# Patient Record
Sex: Female | Born: 1935 | Race: White | Hispanic: No | Marital: Married | Smoking: Never smoker
Health system: Southern US, Community
[De-identification: ages and names within clinical notes are randomized; demographics above are authoritative.]

## PROBLEM LIST (undated history)

## (undated) DIAGNOSIS — Z9889 Other specified postprocedural states: Secondary | ICD-10-CM

## (undated) DIAGNOSIS — E785 Hyperlipidemia, unspecified: Secondary | ICD-10-CM

## (undated) DIAGNOSIS — H409 Unspecified glaucoma: Secondary | ICD-10-CM

## (undated) DIAGNOSIS — M1990 Unspecified osteoarthritis, unspecified site: Secondary | ICD-10-CM

## (undated) DIAGNOSIS — I10 Essential (primary) hypertension: Secondary | ICD-10-CM

## (undated) DIAGNOSIS — E039 Hypothyroidism, unspecified: Secondary | ICD-10-CM

## (undated) DIAGNOSIS — R112 Nausea with vomiting, unspecified: Secondary | ICD-10-CM

## (undated) DIAGNOSIS — F32A Depression, unspecified: Secondary | ICD-10-CM

## (undated) DIAGNOSIS — F329 Major depressive disorder, single episode, unspecified: Secondary | ICD-10-CM

## (undated) HISTORY — PX: JOINT REPLACEMENT: SHX530

## (undated) HISTORY — PX: KNEE ARTHROSCOPY: SHX127

## (undated) HISTORY — PX: ABDOMINAL HYSTERECTOMY: SHX81

## (undated) HISTORY — PX: TONSILLECTOMY: SUR1361

## (undated) HISTORY — PX: CATARACT EXTRACTION W/ INTRAOCULAR LENS IMPLANT: SHX1309

## (undated) HISTORY — PX: OSTEOTOMY TOES: SUR996

---

## 1998-02-07 ENCOUNTER — Ambulatory Visit (HOSPITAL_COMMUNITY): Admission: RE | Admit: 1998-02-07 | Discharge: 1998-02-07 | Payer: Self-pay | Admitting: Obstetrics and Gynecology

## 1998-07-20 ENCOUNTER — Other Ambulatory Visit: Admission: RE | Admit: 1998-07-20 | Discharge: 1998-07-20 | Payer: Self-pay | Admitting: Obstetrics and Gynecology

## 1999-08-15 ENCOUNTER — Other Ambulatory Visit: Admission: RE | Admit: 1999-08-15 | Discharge: 1999-08-15 | Payer: Self-pay | Admitting: Obstetrics and Gynecology

## 1999-09-04 ENCOUNTER — Ambulatory Visit (HOSPITAL_COMMUNITY): Admission: RE | Admit: 1999-09-04 | Discharge: 1999-09-04 | Payer: Self-pay | Admitting: Obstetrics and Gynecology

## 1999-09-04 ENCOUNTER — Encounter: Payer: Self-pay | Admitting: Obstetrics and Gynecology

## 2000-02-12 ENCOUNTER — Encounter: Payer: Self-pay | Admitting: Obstetrics and Gynecology

## 2000-02-12 ENCOUNTER — Encounter: Admission: RE | Admit: 2000-02-12 | Discharge: 2000-02-12 | Payer: Self-pay | Admitting: Obstetrics and Gynecology

## 2000-10-03 ENCOUNTER — Ambulatory Visit (HOSPITAL_COMMUNITY): Admission: RE | Admit: 2000-10-03 | Discharge: 2000-10-03 | Payer: Self-pay | Admitting: Gastroenterology

## 2001-01-08 ENCOUNTER — Encounter: Payer: Self-pay | Admitting: Obstetrics and Gynecology

## 2001-01-08 ENCOUNTER — Ambulatory Visit (HOSPITAL_COMMUNITY): Admission: RE | Admit: 2001-01-08 | Discharge: 2001-01-08 | Payer: Self-pay | Admitting: Obstetrics and Gynecology

## 2001-02-24 ENCOUNTER — Encounter: Payer: Self-pay | Admitting: Obstetrics and Gynecology

## 2001-02-24 ENCOUNTER — Encounter: Admission: RE | Admit: 2001-02-24 | Discharge: 2001-02-24 | Payer: Self-pay | Admitting: Obstetrics and Gynecology

## 2001-11-14 ENCOUNTER — Encounter: Admission: RE | Admit: 2001-11-14 | Discharge: 2001-11-14 | Payer: Self-pay | Admitting: Obstetrics and Gynecology

## 2001-11-14 ENCOUNTER — Encounter: Payer: Self-pay | Admitting: Obstetrics and Gynecology

## 2002-01-14 ENCOUNTER — Encounter: Payer: Self-pay | Admitting: Obstetrics and Gynecology

## 2002-01-14 ENCOUNTER — Ambulatory Visit (HOSPITAL_COMMUNITY): Admission: RE | Admit: 2002-01-14 | Discharge: 2002-01-14 | Payer: Self-pay | Admitting: Obstetrics and Gynecology

## 2002-02-26 ENCOUNTER — Encounter: Payer: Self-pay | Admitting: Obstetrics and Gynecology

## 2002-02-26 ENCOUNTER — Encounter: Admission: RE | Admit: 2002-02-26 | Discharge: 2002-02-26 | Payer: Self-pay | Admitting: Obstetrics and Gynecology

## 2003-03-09 ENCOUNTER — Encounter: Admission: RE | Admit: 2003-03-09 | Discharge: 2003-03-09 | Payer: Self-pay | Admitting: Obstetrics and Gynecology

## 2003-03-09 ENCOUNTER — Encounter: Payer: Self-pay | Admitting: Obstetrics and Gynecology

## 2004-03-13 ENCOUNTER — Encounter: Admission: RE | Admit: 2004-03-13 | Discharge: 2004-03-13 | Payer: Self-pay | Admitting: Obstetrics and Gynecology

## 2004-07-24 ENCOUNTER — Other Ambulatory Visit: Payer: Self-pay

## 2004-09-02 ENCOUNTER — Ambulatory Visit: Payer: Self-pay | Admitting: Ophthalmology

## 2004-12-26 ENCOUNTER — Ambulatory Visit: Payer: Self-pay | Admitting: Gastroenterology

## 2005-01-12 ENCOUNTER — Ambulatory Visit: Payer: Self-pay | Admitting: Podiatry

## 2005-03-23 ENCOUNTER — Encounter: Admission: RE | Admit: 2005-03-23 | Discharge: 2005-03-23 | Payer: Self-pay | Admitting: Obstetrics and Gynecology

## 2005-03-29 ENCOUNTER — Other Ambulatory Visit: Admission: RE | Admit: 2005-03-29 | Discharge: 2005-03-29 | Payer: Self-pay | Admitting: Obstetrics and Gynecology

## 2005-10-02 ENCOUNTER — Ambulatory Visit: Payer: Self-pay | Admitting: Internal Medicine

## 2006-01-22 ENCOUNTER — Ambulatory Visit: Payer: Self-pay | Admitting: Ophthalmology

## 2006-01-30 ENCOUNTER — Ambulatory Visit: Payer: Self-pay | Admitting: Ophthalmology

## 2006-03-27 ENCOUNTER — Encounter: Admission: RE | Admit: 2006-03-27 | Discharge: 2006-03-27 | Payer: Self-pay | Admitting: Obstetrics and Gynecology

## 2006-04-11 ENCOUNTER — Ambulatory Visit: Payer: Self-pay | Admitting: Internal Medicine

## 2007-01-29 ENCOUNTER — Ambulatory Visit (HOSPITAL_COMMUNITY): Admission: RE | Admit: 2007-01-29 | Discharge: 2007-01-29 | Payer: Self-pay | Admitting: Obstetrics and Gynecology

## 2007-03-25 ENCOUNTER — Ambulatory Visit: Payer: Self-pay | Admitting: Gastroenterology

## 2007-04-07 ENCOUNTER — Other Ambulatory Visit: Admission: RE | Admit: 2007-04-07 | Discharge: 2007-04-07 | Payer: Self-pay | Admitting: Obstetrics and Gynecology

## 2007-04-14 ENCOUNTER — Encounter: Admission: RE | Admit: 2007-04-14 | Discharge: 2007-04-14 | Payer: Self-pay | Admitting: Obstetrics and Gynecology

## 2008-04-15 ENCOUNTER — Encounter: Admission: RE | Admit: 2008-04-15 | Discharge: 2008-04-15 | Payer: Self-pay | Admitting: Obstetrics and Gynecology

## 2008-05-04 ENCOUNTER — Ambulatory Visit: Payer: Self-pay | Admitting: Unknown Physician Specialty

## 2008-05-04 ENCOUNTER — Other Ambulatory Visit: Payer: Self-pay

## 2008-05-10 ENCOUNTER — Ambulatory Visit: Payer: Self-pay | Admitting: Unknown Physician Specialty

## 2008-07-14 ENCOUNTER — Ambulatory Visit: Payer: Self-pay | Admitting: Internal Medicine

## 2008-08-12 ENCOUNTER — Ambulatory Visit: Payer: Self-pay | Admitting: Ophthalmology

## 2008-08-23 ENCOUNTER — Ambulatory Visit: Payer: Self-pay | Admitting: Ophthalmology

## 2008-10-20 ENCOUNTER — Ambulatory Visit: Payer: Self-pay | Admitting: Internal Medicine

## 2009-04-18 ENCOUNTER — Ambulatory Visit: Payer: Self-pay | Admitting: Internal Medicine

## 2009-04-21 ENCOUNTER — Encounter: Admission: RE | Admit: 2009-04-21 | Discharge: 2009-04-21 | Payer: Self-pay | Admitting: Obstetrics and Gynecology

## 2009-05-13 ENCOUNTER — Other Ambulatory Visit: Admission: RE | Admit: 2009-05-13 | Discharge: 2009-05-13 | Payer: Self-pay | Admitting: Obstetrics and Gynecology

## 2009-10-18 ENCOUNTER — Ambulatory Visit: Payer: Self-pay | Admitting: Internal Medicine

## 2010-04-27 ENCOUNTER — Encounter: Admission: RE | Admit: 2010-04-27 | Discharge: 2010-04-27 | Payer: Self-pay | Admitting: Obstetrics and Gynecology

## 2010-06-12 ENCOUNTER — Ambulatory Visit: Payer: Self-pay | Admitting: Gastroenterology

## 2011-05-01 ENCOUNTER — Other Ambulatory Visit: Payer: Self-pay | Admitting: Obstetrics and Gynecology

## 2011-05-01 DIAGNOSIS — Z1231 Encounter for screening mammogram for malignant neoplasm of breast: Secondary | ICD-10-CM

## 2011-05-11 ENCOUNTER — Ambulatory Visit: Payer: Self-pay | Admitting: Physical Medicine and Rehabilitation

## 2011-05-16 ENCOUNTER — Ambulatory Visit
Admission: RE | Admit: 2011-05-16 | Discharge: 2011-05-16 | Disposition: A | Discharge status: Home / Self Care | Payer: PRIVATE HEALTH INSURANCE | Source: Ambulatory Visit | Attending: Obstetrics and Gynecology | Admitting: Obstetrics and Gynecology

## 2011-05-16 DIAGNOSIS — Z1231 Encounter for screening mammogram for malignant neoplasm of breast: Secondary | ICD-10-CM

## 2011-08-03 ENCOUNTER — Ambulatory Visit: Payer: Self-pay | Admitting: Unknown Physician Specialty

## 2011-08-09 ENCOUNTER — Ambulatory Visit: Payer: Self-pay | Admitting: Unknown Physician Specialty

## 2012-05-12 ENCOUNTER — Other Ambulatory Visit: Payer: Self-pay | Admitting: Internal Medicine

## 2012-05-12 ENCOUNTER — Other Ambulatory Visit: Payer: Self-pay | Admitting: Obstetrics and Gynecology

## 2012-05-12 DIAGNOSIS — Z1231 Encounter for screening mammogram for malignant neoplasm of breast: Secondary | ICD-10-CM

## 2012-05-21 ENCOUNTER — Ambulatory Visit
Admission: RE | Admit: 2012-05-21 | Discharge: 2012-05-21 | Disposition: A | Discharge status: Home / Self Care | Payer: PRIVATE HEALTH INSURANCE | Source: Ambulatory Visit | Attending: Internal Medicine | Admitting: Internal Medicine

## 2012-05-21 DIAGNOSIS — Z1231 Encounter for screening mammogram for malignant neoplasm of breast: Secondary | ICD-10-CM

## 2013-05-12 ENCOUNTER — Other Ambulatory Visit: Payer: Self-pay

## 2013-05-12 DIAGNOSIS — Z1231 Encounter for screening mammogram for malignant neoplasm of breast: Secondary | ICD-10-CM

## 2013-06-10 ENCOUNTER — Ambulatory Visit
Admission: RE | Admit: 2013-06-10 | Discharge: 2013-06-10 | Disposition: A | Discharge status: Home / Self Care | Payer: PRIVATE HEALTH INSURANCE | Source: Ambulatory Visit

## 2013-06-10 DIAGNOSIS — Z1231 Encounter for screening mammogram for malignant neoplasm of breast: Secondary | ICD-10-CM

## 2013-09-18 ENCOUNTER — Ambulatory Visit: Payer: Self-pay | Admitting: Ophthalmology

## 2014-05-14 DIAGNOSIS — F411 Generalized anxiety disorder: Secondary | ICD-10-CM | POA: Insufficient documentation

## 2014-05-14 DIAGNOSIS — N952 Postmenopausal atrophic vaginitis: Secondary | ICD-10-CM | POA: Insufficient documentation

## 2014-05-14 DIAGNOSIS — N951 Menopausal and female climacteric states: Secondary | ICD-10-CM | POA: Insufficient documentation

## 2014-05-14 DIAGNOSIS — I10 Essential (primary) hypertension: Secondary | ICD-10-CM | POA: Insufficient documentation

## 2014-05-14 DIAGNOSIS — E785 Hyperlipidemia, unspecified: Secondary | ICD-10-CM | POA: Insufficient documentation

## 2014-06-21 ENCOUNTER — Other Ambulatory Visit: Payer: Self-pay

## 2014-06-21 DIAGNOSIS — Z1231 Encounter for screening mammogram for malignant neoplasm of breast: Secondary | ICD-10-CM

## 2014-06-24 ENCOUNTER — Ambulatory Visit
Admission: RE | Admit: 2014-06-24 | Discharge: 2014-06-24 | Disposition: A | Discharge status: Home / Self Care | Payer: Medicare Other | Source: Ambulatory Visit

## 2014-06-24 ENCOUNTER — Encounter (INDEPENDENT_AMBULATORY_CARE_PROVIDER_SITE_OTHER): Payer: Self-pay

## 2014-06-24 DIAGNOSIS — Z1231 Encounter for screening mammogram for malignant neoplasm of breast: Secondary | ICD-10-CM

## 2014-06-28 ENCOUNTER — Other Ambulatory Visit: Payer: Self-pay | Admitting: Family Medicine

## 2014-06-28 DIAGNOSIS — R928 Other abnormal and inconclusive findings on diagnostic imaging of breast: Secondary | ICD-10-CM

## 2014-07-05 ENCOUNTER — Ambulatory Visit
Admission: RE | Admit: 2014-07-05 | Discharge: 2014-07-05 | Disposition: A | Discharge status: Home / Self Care | Payer: Medicare Other | Source: Ambulatory Visit | Attending: Family Medicine | Admitting: Family Medicine

## 2014-07-05 DIAGNOSIS — R928 Other abnormal and inconclusive findings on diagnostic imaging of breast: Secondary | ICD-10-CM

## 2015-02-28 DIAGNOSIS — M5030 Other cervical disc degeneration, unspecified cervical region: Secondary | ICD-10-CM | POA: Insufficient documentation

## 2015-02-28 DIAGNOSIS — M5412 Radiculopathy, cervical region: Secondary | ICD-10-CM | POA: Insufficient documentation

## 2015-07-08 ENCOUNTER — Other Ambulatory Visit: Payer: Self-pay

## 2015-07-08 DIAGNOSIS — Z1231 Encounter for screening mammogram for malignant neoplasm of breast: Secondary | ICD-10-CM

## 2015-07-12 ENCOUNTER — Ambulatory Visit
Admission: RE | Admit: 2015-07-12 | Discharge: 2015-07-12 | Disposition: A | Discharge status: Home / Self Care | Payer: Medicare Other | Source: Ambulatory Visit

## 2015-07-12 DIAGNOSIS — Z1231 Encounter for screening mammogram for malignant neoplasm of breast: Secondary | ICD-10-CM

## 2015-07-27 ENCOUNTER — Encounter
Admission: RE | Admit: 2015-07-27 | Discharge: 2015-07-27 | Disposition: A | Discharge status: Home / Self Care | Payer: Medicare Other | Source: Ambulatory Visit | Attending: Orthopedic Surgery | Admitting: Orthopedic Surgery

## 2015-07-27 DIAGNOSIS — Z01810 Encounter for preprocedural cardiovascular examination: Secondary | ICD-10-CM | POA: Insufficient documentation

## 2015-07-27 DIAGNOSIS — Z01812 Encounter for preprocedural laboratory examination: Secondary | ICD-10-CM | POA: Diagnosis present

## 2015-07-27 HISTORY — DX: Hypothyroidism, unspecified: E03.9

## 2015-07-27 HISTORY — DX: Depression, unspecified: F32.A

## 2015-07-27 HISTORY — DX: Major depressive disorder, single episode, unspecified: F32.9

## 2015-07-27 HISTORY — DX: Essential (primary) hypertension: I10

## 2015-07-27 HISTORY — DX: Hyperlipidemia, unspecified: E78.5

## 2015-07-27 LAB — CBC
HCT: 42.6 % (ref 35.0–47.0)
Hemoglobin: 14.2 g/dL (ref 12.0–16.0)
MCH: 30.5 pg (ref 26.0–34.0)
MCHC: 33.3 g/dL (ref 32.0–36.0)
MCV: 91.7 fL (ref 80.0–100.0)
PLATELETS: 327 10*3/uL (ref 150–440)
RBC: 4.64 MIL/uL (ref 3.80–5.20)
RDW: 13.7 % (ref 11.5–14.5)
WBC: 8.2 10*3/uL (ref 3.6–11.0)

## 2015-07-27 LAB — URINALYSIS COMPLETE WITH MICROSCOPIC (ARMC ONLY)
BACTERIA UA: NONE SEEN
Bilirubin Urine: NEGATIVE
GLUCOSE, UA: NEGATIVE mg/dL
HGB URINE DIPSTICK: NEGATIVE
Ketones, ur: NEGATIVE mg/dL
Nitrite: NEGATIVE
PH: 6 (ref 5.0–8.0)
PROTEIN: NEGATIVE mg/dL
SPECIFIC GRAVITY, URINE: 1.015 (ref 1.005–1.030)

## 2015-07-27 LAB — TYPE AND SCREEN
ABO/RH(D): A NEG
Antibody Screen: NEGATIVE

## 2015-07-27 LAB — APTT: aPTT: 29 seconds (ref 24–36)

## 2015-07-27 LAB — BASIC METABOLIC PANEL
Anion gap: 9 (ref 5–15)
BUN: 22 mg/dL — AB (ref 6–20)
CALCIUM: 10.2 mg/dL (ref 8.9–10.3)
CO2: 29 mmol/L (ref 22–32)
CREATININE: 0.74 mg/dL (ref 0.44–1.00)
Chloride: 100 mmol/L — ABNORMAL LOW (ref 101–111)
GFR calc Af Amer: >60 mL/min (ref >60)
GFR calc non Af Amer: >60 mL/min (ref >60)
GLUCOSE: 135 mg/dL — AB (ref 65–99)
Potassium: 2.9 mmol/L — CL (ref 3.5–5.1)
Sodium: 138 mmol/L (ref 135–145)

## 2015-07-27 LAB — SURGICAL PCR SCREEN
MRSA, PCR: NEGATIVE
STAPHYLOCOCCUS AUREUS: NEGATIVE

## 2015-07-27 LAB — ABO/RH: ABO/RH(D): A NEG

## 2015-07-27 LAB — PROTIME-INR
INR: 0.92
Prothrombin Time: 12.6 seconds (ref 11.4–15.0)

## 2015-07-27 LAB — SEDIMENTATION RATE: Sed Rate: 8 mm/hr (ref 0–22)

## 2015-07-27 NOTE — Pre-Procedure Instructions (Signed)
[facility] called and faxed regarding need for medical clearance.

## 2015-07-27 NOTE — Patient Instructions (Signed)
  Your procedure is scheduled on: 08/17/15 Wed Report to Day Surgery. To find out your arrival time please call [phone] between 1PM - 3PM on 08/16/15 Tues.  Remember: Instructions that are not followed completely may result in serious medical risk, up to and including death, or upon the discretion of your surgeon and anesthesiologist your surgery may need to be rescheduled.    __x__ 1. Do not eat food or drink liquids after midnight. No gum chewing or hard candies.     __x__ 2. No Alcohol for 24 hours before or after surgery.   ____ 3. Bring all medications with you on the day of surgery if instructed.    _x___ 4. Notify your doctor if there is any change in your medical condition     (cold, fever, infections).     Do not wear jewelry, make-up, hairpins, clips or nail polish.  Do not wear lotions, powders, or perfumes. You may wear deodorant.  Do not shave 48 hours prior to surgery. Men may shave face and neck.  Do not bring valuables to the hospital.    [facility] is not responsible for any belongings or valuables.               Contacts, dentures or bridgework may not be worn into surgery.  Leave your suitcase in the car. After surgery it may be brought to your room.  For patients admitted to the hospital, discharge time is determined by your                treatment team.   Patients discharged the day of surgery will not be allowed to drive home.   Please read over the following fact sheets that you were given:      _x___ Take these medicines the morning of surgery with A SIP OF WATER:    1. amLODipine-benazepril (LOTREL) 10-20 MG per capsule  2. DULoxetine (CYMBALTA) 60 MG capsule  3. levothyroxine (SYNTHROID, LEVOTHROID) 75 MCG   4.  5.  6.  ____ Fleet Enema (as directed)   _x__ Use CHG Soap as directed  ____ Use inhalers on the day of surgery  ____ Stop metformin 2 days prior to surgery    ____ Take 1/2 of usual insulin dose the night before surgery and  none on the morning of surgery.   _x___ Stop Coumadin/Plavix/aspirin on stop aspirin 1 week before surgery  _x___ Stop Anti-inflammatories on Stop Mobic 1 week before surgery   ____ Stop supplements until after surgery.    ____ Bring C-Pap to the hospital.

## 2015-07-27 NOTE — Pre-Procedure Instructions (Signed)
Potassium results (2.9) called and faxed to Dr. Ernest Pine office (results given to Elizabeth Palau) and instructed that the patient needed to be started on a potassium supplement.

## 2015-07-27 NOTE — Pre-Procedure Instructions (Signed)
Anesthesia called regarding EKG. Medical clearance requested.

## 2015-07-29 LAB — URINE CULTURE

## 2015-08-03 ENCOUNTER — Other Ambulatory Visit: Payer: Medicare Other

## 2015-08-10 NOTE — OR Nursing (Signed)
Clearance by Dr Larwance Sachs. Under [facility] epic visit 08/09/15. Repeat kt normal

## 2015-08-17 ENCOUNTER — Encounter: Payer: Self-pay | Admitting: *Deleted

## 2015-08-17 ENCOUNTER — Inpatient Hospital Stay
Admission: RE | Admit: 2015-08-17 | Discharge: 2015-08-20 | DRG: 470 | Disposition: A | Discharge status: Home Health | Payer: Medicare Other | Source: Ambulatory Visit | Attending: Orthopedic Surgery | Admitting: Orthopedic Surgery

## 2015-08-17 ENCOUNTER — Inpatient Hospital Stay: Payer: Medicare Other

## 2015-08-17 ENCOUNTER — Inpatient Hospital Stay: Payer: Medicare Other | Admitting: Anesthesiology

## 2015-08-17 ENCOUNTER — Encounter
Admission: RE | Disposition: A | Discharge status: Home Health | Payer: Self-pay | Source: Ambulatory Visit | Attending: Orthopedic Surgery

## 2015-08-17 DIAGNOSIS — E876 Hypokalemia: Secondary | ICD-10-CM | POA: Diagnosis not present

## 2015-08-17 DIAGNOSIS — Z882 Allergy status to sulfonamides status: Secondary | ICD-10-CM

## 2015-08-17 DIAGNOSIS — Z880 Allergy status to penicillin: Secondary | ICD-10-CM | POA: Diagnosis not present

## 2015-08-17 DIAGNOSIS — E785 Hyperlipidemia, unspecified: Secondary | ICD-10-CM | POA: Diagnosis present

## 2015-08-17 DIAGNOSIS — E039 Hypothyroidism, unspecified: Secondary | ICD-10-CM | POA: Diagnosis present

## 2015-08-17 DIAGNOSIS — I10 Essential (primary) hypertension: Secondary | ICD-10-CM | POA: Diagnosis present

## 2015-08-17 DIAGNOSIS — M5136 Other intervertebral disc degeneration, lumbar region: Secondary | ICD-10-CM | POA: Diagnosis present

## 2015-08-17 DIAGNOSIS — K589 Irritable bowel syndrome without diarrhea: Secondary | ICD-10-CM | POA: Diagnosis present

## 2015-08-17 DIAGNOSIS — Z79899 Other long term (current) drug therapy: Secondary | ICD-10-CM | POA: Diagnosis not present

## 2015-08-17 DIAGNOSIS — Z888 Allergy status to other drugs, medicaments and biological substances status: Secondary | ICD-10-CM | POA: Diagnosis not present

## 2015-08-17 DIAGNOSIS — M1711 Unilateral primary osteoarthritis, right knee: Principal | ICD-10-CM | POA: Diagnosis present

## 2015-08-17 DIAGNOSIS — F329 Major depressive disorder, single episode, unspecified: Secondary | ICD-10-CM | POA: Diagnosis present

## 2015-08-17 DIAGNOSIS — Z881 Allergy status to other antibiotic agents status: Secondary | ICD-10-CM

## 2015-08-17 DIAGNOSIS — Z96651 Presence of right artificial knee joint: Secondary | ICD-10-CM

## 2015-08-17 DIAGNOSIS — Z96659 Presence of unspecified artificial knee joint: Secondary | ICD-10-CM

## 2015-08-17 HISTORY — PX: KNEE ARTHROPLASTY: SHX992

## 2015-08-17 LAB — TYPE AND SCREEN
ABO/RH(D): A NEG
Antibody Screen: NEGATIVE

## 2015-08-17 LAB — CREATININE, SERUM
CREATININE: 0.53 mg/dL (ref 0.44–1.00)
GFR calc Af Amer: >60 mL/min (ref >60)
GFR calc non Af Amer: >60 mL/min (ref >60)

## 2015-08-17 LAB — POCT I-STAT 4, (NA,K, GLUC, HGB,HCT)
Glucose, Bld: 114 mg/dL — ABNORMAL HIGH (ref 65–99)
HEMATOCRIT: 43 % (ref 36.0–46.0)
Hemoglobin: 14.6 g/dL (ref 12.0–15.0)
Potassium: 3.3 mmol/L — ABNORMAL LOW (ref 3.5–5.1)
SODIUM: 139 mmol/L (ref 135–145)

## 2015-08-17 SURGERY — ARTHROPLASTY, KNEE, TOTAL, USING IMAGELESS COMPUTER-ASSISTED NAVIGATION
Anesthesia: Monitor Anesthesia Care | Site: Knee | Laterality: Right | Wound class: Clean

## 2015-08-17 MED ORDER — ALUM & MAG HYDROXIDE-SIMETH 200-200-20 MG/5ML PO SUSP: 30.0000 mL | ORAL | Status: DC | PRN

## 2015-08-17 MED ORDER — ACETAMINOPHEN 325 MG PO TABS
650.0000 mg | ORAL_TABLET | Freq: Four times a day (QID) | ORAL | Status: DC | PRN
  Administered 2015-08-19: 650 mg via ORAL
  Filled 2015-08-17: qty 2

## 2015-08-17 MED ORDER — BUPIVACAINE HCL (PF) 0.25 % IJ SOLN
INTRAMUSCULAR | Status: AC
  Filled 2015-08-17: qty 30

## 2015-08-17 MED ORDER — DIPHENHYDRAMINE HCL 12.5 MG/5ML PO ELIX: 12.5000 mg | ORAL_SOLUTION | ORAL | Status: DC | PRN

## 2015-08-17 MED ORDER — LEVOTHYROXINE SODIUM 75 MCG PO TABS
75.0000 ug | ORAL_TABLET | Freq: Every day | ORAL | Status: DC
  Administered 2015-08-18 – 2015-08-19 (×2): 75 ug via ORAL
  Filled 2015-08-17 (×3): qty 1

## 2015-08-17 MED ORDER — HYDROCHLOROTHIAZIDE 25 MG PO TABS
25.0000 mg | ORAL_TABLET | Freq: Every day | ORAL | Status: DC
  Administered 2015-08-19 – 2015-08-20 (×2): 25 mg via ORAL
  Filled 2015-08-17 (×4): qty 1

## 2015-08-17 MED ORDER — FENTANYL CITRATE (PF) 100 MCG/2ML IJ SOLN
INTRAMUSCULAR | Status: DC | PRN
  Administered 2015-08-17: 50 ug via INTRAVENOUS

## 2015-08-17 MED ORDER — MIDAZOLAM HCL 5 MG/5ML IJ SOLN
INTRAMUSCULAR | Status: DC | PRN
  Administered 2015-08-17: 1 mg via INTRAVENOUS

## 2015-08-17 MED ORDER — NEOMYCIN-POLYMYXIN B GU 40-200000 IR SOLN
Status: DC | PRN
  Administered 2015-08-17: 2 mL

## 2015-08-17 MED ORDER — TRAMADOL HCL 50 MG PO TABS
50.0000 mg | ORAL_TABLET | ORAL | Status: DC | PRN
  Administered 2015-08-17 – 2015-08-18 (×2): 50 mg via ORAL
  Administered 2015-08-20 (×2): 100 mg via ORAL
  Filled 2015-08-17: qty 1
  Filled 2015-08-17 (×2): qty 2
  Filled 2015-08-17: qty 1

## 2015-08-17 MED ORDER — BUPIVACAINE LIPOSOME 1.3 % IJ SUSP
INTRAMUSCULAR | Status: AC
  Filled 2015-08-17: qty 20

## 2015-08-17 MED ORDER — FAMOTIDINE 20 MG PO TABS
ORAL_TABLET | ORAL | Status: AC
  Administered 2015-08-17: 20 mg via ORAL
  Filled 2015-08-17: qty 1

## 2015-08-17 MED ORDER — FENTANYL CITRATE (PF) 100 MCG/2ML IJ SOLN
INTRAMUSCULAR | Status: AC
  Administered 2015-08-17: 25 ug via INTRAVENOUS
  Filled 2015-08-17: qty 2

## 2015-08-17 MED ORDER — METOCLOPRAMIDE HCL 10 MG PO TABS
10.0000 mg | ORAL_TABLET | Freq: Three times a day (TID) | ORAL | Status: AC
  Administered 2015-08-17 – 2015-08-19 (×8): 10 mg via ORAL
  Filled 2015-08-17 (×9): qty 1

## 2015-08-17 MED ORDER — ADULT MULTIVITAMIN W/MINERALS CH
ORAL_TABLET | Freq: Every day | ORAL | Status: DC
  Administered 2015-08-18 – 2015-08-20 (×3): 1 via ORAL
  Filled 2015-08-17 (×4): qty 1

## 2015-08-17 MED ORDER — OXYCODONE HCL 5 MG/5ML PO SOLN: 5.0000 mg | Freq: Once | ORAL | Status: DC | PRN

## 2015-08-17 MED ORDER — FLEET ENEMA 7-19 GM/118ML RE ENEM: 1.0000 | ENEMA | Freq: Once | RECTAL | Status: DC | PRN

## 2015-08-17 MED ORDER — BISACODYL 10 MG RE SUPP
10.0000 mg | Freq: Every day | RECTAL | Status: DC | PRN
  Administered 2015-08-19: 10 mg via RECTAL
  Filled 2015-08-17 (×2): qty 1

## 2015-08-17 MED ORDER — FERROUS SULFATE 325 (65 FE) MG PO TABS
325.0000 mg | ORAL_TABLET | Freq: Two times a day (BID) | ORAL | Status: DC
  Administered 2015-08-18 – 2015-08-20 (×5): 325 mg via ORAL
  Filled 2015-08-17 (×6): qty 1

## 2015-08-17 MED ORDER — NEOMYCIN-POLYMYXIN B GU 40-200000 IR SOLN
Status: AC
  Filled 2015-08-17: qty 20

## 2015-08-17 MED ORDER — OXYCODONE HCL 5 MG PO TABS: 5.0000 mg | ORAL_TABLET | ORAL | Status: DC | PRN

## 2015-08-17 MED ORDER — CLINDAMYCIN PHOSPHATE 600 MG/50ML IV SOLN
600.0000 mg | Freq: Four times a day (QID) | INTRAVENOUS | Status: AC
  Administered 2015-08-17 – 2015-08-18 (×4): 600 mg via INTRAVENOUS
  Filled 2015-08-17 (×4): qty 50

## 2015-08-17 MED ORDER — AMLODIPINE BESY-BENAZEPRIL HCL 10-20 MG PO CAPS: 1.0000 | ORAL_CAPSULE | Freq: Every day | ORAL | Status: DC

## 2015-08-17 MED ORDER — OXYCODONE HCL 5 MG PO TABS
5.0000 mg | ORAL_TABLET | ORAL | Status: DC | PRN
  Administered 2015-08-17 – 2015-08-19 (×5): 5 mg via ORAL
  Filled 2015-08-17 (×5): qty 1

## 2015-08-17 MED ORDER — MENTHOL 3 MG MT LOZG: 1.0000 | LOZENGE | OROMUCOSAL | Status: DC | PRN

## 2015-08-17 MED ORDER — CLINDAMYCIN PHOSPHATE 600 MG/50ML IV SOLN
INTRAVENOUS | Status: AC
  Administered 2015-08-17: 600 mg via INTRAVENOUS
  Filled 2015-08-17: qty 50

## 2015-08-17 MED ORDER — AMLODIPINE BESYLATE 10 MG PO TABS
10.0000 mg | ORAL_TABLET | Freq: Every day | ORAL | Status: DC
  Administered 2015-08-18 – 2015-08-20 (×3): 10 mg via ORAL
  Filled 2015-08-17 (×4): qty 1

## 2015-08-17 MED ORDER — CYCLOBENZAPRINE HCL 10 MG PO TABS: 5.0000 mg | ORAL_TABLET | Freq: Every evening | ORAL | Status: DC | PRN

## 2015-08-17 MED ORDER — BUPIVACAINE HCL (PF) 0.5 % IJ SOLN
INTRAMUSCULAR | Status: DC | PRN
  Administered 2015-08-17: 2.5 mL

## 2015-08-17 MED ORDER — TRANEXAMIC ACID 1000 MG/10ML IV SOLN
1000.0000 mg | Freq: Once | INTRAVENOUS | Status: AC
  Administered 2015-08-17: 1000 mg via INTRAVENOUS
  Filled 2015-08-17: qty 10

## 2015-08-17 MED ORDER — DULOXETINE HCL 60 MG PO CPEP
60.0000 mg | ORAL_CAPSULE | Freq: Every day | ORAL | Status: DC
  Administered 2015-08-18 – 2015-08-20 (×3): 60 mg via ORAL
  Filled 2015-08-17 (×3): qty 2
  Filled 2015-08-17: qty 1

## 2015-08-17 MED ORDER — FLUTICASONE PROPIONATE 50 MCG/ACT NA SUSP
1.0000 | Freq: Every day | NASAL | Status: DC | PRN
  Filled 2015-08-17: qty 16

## 2015-08-17 MED ORDER — ACETAMINOPHEN 10 MG/ML IV SOLN
INTRAVENOUS | Status: DC | PRN
  Administered 2015-08-17: 1000 mg via INTRAVENOUS

## 2015-08-17 MED ORDER — FAMOTIDINE 20 MG PO TABS
20.0000 mg | ORAL_TABLET | Freq: Once | ORAL | Status: AC
  Administered 2015-08-17: 20 mg via ORAL

## 2015-08-17 MED ORDER — SODIUM CHLORIDE 0.9 % IV SOLN
INTRAVENOUS | Status: DC
  Administered 2015-08-17 – 2015-08-18 (×3): via INTRAVENOUS

## 2015-08-17 MED ORDER — SODIUM CHLORIDE 0.9 % IJ SOLN
3.0000 mL | Freq: Two times a day (BID) | INTRAMUSCULAR | Status: DC
  Administered 2015-08-17: 3 mL via INTRAVENOUS

## 2015-08-17 MED ORDER — ONDANSETRON HCL 4 MG PO TABS
4.0000 mg | ORAL_TABLET | Freq: Four times a day (QID) | ORAL | Status: DC | PRN
  Administered 2015-08-19: 4 mg via ORAL
  Filled 2015-08-17: qty 1

## 2015-08-17 MED ORDER — ACETAMINOPHEN 650 MG RE SUPP: 650.0000 mg | Freq: Four times a day (QID) | RECTAL | Status: DC | PRN

## 2015-08-17 MED ORDER — CLINDAMYCIN PHOSPHATE 600 MG/50ML IV SOLN: 600.0000 mg | Freq: Once | INTRAVENOUS | Status: DC

## 2015-08-17 MED ORDER — BUPIVACAINE-EPINEPHRINE 0.25% -1:200000 IJ SOLN
INTRAMUSCULAR | Status: DC | PRN
Start: 2015-08-17 — End: 2015-08-17
  Administered 2015-08-17: 30 mL

## 2015-08-17 MED ORDER — MAGNESIUM HYDROXIDE 400 MG/5ML PO SUSP
30.0000 mL | Freq: Every day | ORAL | Status: DC | PRN
  Administered 2015-08-18: 30 mL via ORAL
  Filled 2015-08-17: qty 30

## 2015-08-17 MED ORDER — INFLUENZA VAC SPLIT QUAD 0.5 ML IM SUSY
0.5000 mL | PREFILLED_SYRINGE | INTRAMUSCULAR | Status: AC
  Administered 2015-08-18: 0.5 mL via INTRAMUSCULAR
  Filled 2015-08-17: qty 0.5

## 2015-08-17 MED ORDER — FENTANYL CITRATE (PF) 100 MCG/2ML IJ SOLN
25.0000 ug | INTRAMUSCULAR | Status: DC | PRN
  Administered 2015-08-17 (×4): 25 ug via INTRAVENOUS

## 2015-08-17 MED ORDER — ACETAMINOPHEN 10 MG/ML IV SOLN
1000.0000 mg | Freq: Four times a day (QID) | INTRAVENOUS | Status: AC
  Administered 2015-08-17 – 2015-08-18 (×4): 1000 mg via INTRAVENOUS
  Filled 2015-08-17 (×4): qty 100

## 2015-08-17 MED ORDER — BUPIVACAINE-EPINEPHRINE (PF) 0.25% -1:200000 IJ SOLN
INTRAMUSCULAR | Status: AC
  Filled 2015-08-17: qty 30

## 2015-08-17 MED ORDER — SODIUM CHLORIDE 0.9 % IV SOLN
INTRAVENOUS | Status: DC | PRN
  Administered 2015-08-17: 60 mL

## 2015-08-17 MED ORDER — ENOXAPARIN SODIUM 30 MG/0.3ML ~~LOC~~ SOLN
30.0000 mg | Freq: Two times a day (BID) | SUBCUTANEOUS | Status: DC
  Administered 2015-08-18 – 2015-08-20 (×5): 30 mg via SUBCUTANEOUS
  Filled 2015-08-17 (×7): qty 0.3

## 2015-08-17 MED ORDER — OXYCODONE HCL 5 MG PO TABS
5.0000 mg | ORAL_TABLET | Freq: Once | ORAL | Status: DC | PRN
Start: 2015-08-17 — End: 2015-08-17

## 2015-08-17 MED ORDER — SODIUM CHLORIDE 0.9 % IJ SOLN
INTRAMUSCULAR | Status: AC
  Filled 2015-08-17: qty 100

## 2015-08-17 MED ORDER — PANTOPRAZOLE SODIUM 40 MG PO TBEC
40.0000 mg | DELAYED_RELEASE_TABLET | Freq: Two times a day (BID) | ORAL | Status: DC
  Administered 2015-08-17 – 2015-08-20 (×6): 40 mg via ORAL
  Filled 2015-08-17 (×7): qty 1

## 2015-08-17 MED ORDER — LACTATED RINGERS IV SOLN
INTRAVENOUS | Status: DC
  Administered 2015-08-17 (×2): via INTRAVENOUS

## 2015-08-17 MED ORDER — LATANOPROST 0.005 % OP SOLN
1.0000 [drp] | Freq: Every day | OPHTHALMIC | Status: DC
  Administered 2015-08-17 – 2015-08-19 (×3): 1 [drp] via OPHTHALMIC
  Filled 2015-08-17: qty 2.5

## 2015-08-17 MED ORDER — SENNOSIDES-DOCUSATE SODIUM 8.6-50 MG PO TABS
1.0000 | ORAL_TABLET | Freq: Two times a day (BID) | ORAL | Status: DC
  Administered 2015-08-17 – 2015-08-19 (×5): 1 via ORAL
  Filled 2015-08-17 (×6): qty 1

## 2015-08-17 MED ORDER — ACETAMINOPHEN 10 MG/ML IV SOLN
INTRAVENOUS | Status: AC
  Filled 2015-08-17: qty 100

## 2015-08-17 MED ORDER — PHENOL 1.4 % MT LIQD: 1.0000 | OROMUCOSAL | Status: DC | PRN

## 2015-08-17 MED ORDER — MORPHINE SULFATE (PF) 2 MG/ML IV SOLN
2.0000 mg | INTRAVENOUS | Status: DC | PRN
  Administered 2015-08-17: 2 mg via INTRAVENOUS
  Filled 2015-08-17: qty 1

## 2015-08-17 MED ORDER — PROPOFOL 500 MG/50ML IV EMUL
INTRAVENOUS | Status: DC | PRN
  Administered 2015-08-17: 50 ug/kg/min via INTRAVENOUS

## 2015-08-17 MED ORDER — CALCIUM CARBONATE-VITAMIN D 500-200 MG-UNIT PO TABS
1.0000 | ORAL_TABLET | Freq: Every day | ORAL | Status: DC
  Administered 2015-08-18 – 2015-08-20 (×2): 1 via ORAL
  Filled 2015-08-17 (×4): qty 1

## 2015-08-17 MED ORDER — BENAZEPRIL HCL 20 MG PO TABS
20.0000 mg | ORAL_TABLET | Freq: Every day | ORAL | Status: DC
  Administered 2015-08-18 – 2015-08-20 (×3): 20 mg via ORAL
  Filled 2015-08-17 (×4): qty 1

## 2015-08-17 MED ORDER — SODIUM CHLORIDE 0.9 % IV SOLN
1000.0000 mg | INTRAVENOUS | Status: AC
  Administered 2015-08-17: 1000 mg via INTRAVENOUS
  Filled 2015-08-17: qty 10

## 2015-08-17 MED ORDER — ONDANSETRON HCL 4 MG/2ML IJ SOLN
4.0000 mg | Freq: Four times a day (QID) | INTRAMUSCULAR | Status: DC | PRN
  Administered 2015-08-17: 4 mg via INTRAVENOUS
  Filled 2015-08-17: qty 2

## 2015-08-17 MED ORDER — BUPIVACAINE HCL (PF) 0.5 % IJ SOLN
INTRAMUSCULAR | Status: AC
  Filled 2015-08-17: qty 30

## 2015-08-17 SURGICAL SUPPLY — 58 items
AUTOTRANSFUS HAS 1/8 (MISCELLANEOUS) ×3
BATTERY INSTRU NAVIGATION (MISCELLANEOUS) ×12 IMPLANT
BLADE SAW 1 (BLADE) ×3 IMPLANT
BLADE SAW 1/2 (BLADE) ×3 IMPLANT
BTRY SRG DRVR LF (MISCELLANEOUS) ×4
CANISTER SUCT 1200ML W/VALVE (MISCELLANEOUS) ×3 IMPLANT
CANISTER SUCT 3000ML (MISCELLANEOUS) ×6 IMPLANT
CAP KNEE TOTAL 3 SIGMA ×2 IMPLANT
CATH TRAY METER 16FR LF (MISCELLANEOUS) ×3 IMPLANT
CEMENT HV SMART SET (Cement) ×6 IMPLANT
COOLER POLAR GLACIER W/PUMP (MISCELLANEOUS) ×3 IMPLANT
DRAPE SHEET LG 3/4 BI-LAMINATE (DRAPES) ×5 IMPLANT
DRSG DERMACEA 8X12 NADH (GAUZE/BANDAGES/DRESSINGS) ×3 IMPLANT
DRSG OPSITE POSTOP 4X14 (GAUZE/BANDAGES/DRESSINGS) ×3 IMPLANT
DRSG TEGADERM 4X4.75 (GAUZE/BANDAGES/DRESSINGS) ×3 IMPLANT
DURAPREP 26ML APPLICATOR (WOUND CARE) ×4 IMPLANT
ELECT CAUTERY BLADE 6.4 (BLADE) ×3 IMPLANT
EX-PIN ORTHOLOCK NAV 4X150 (PIN) ×6 IMPLANT
GLOVE BIOGEL M STRL SZ7.5 (GLOVE) ×6 IMPLANT
GLOVE INDICATOR 8.0 STRL GRN (GLOVE) ×5 IMPLANT
GLOVE SURG 9.0 ORTHO LTXF (GLOVE) ×3 IMPLANT
GLOVE SURG ORTHO 9.0 STRL STRW (GLOVE) ×5 IMPLANT
GOWN STRL REUS W/ TWL LRG LVL3 (GOWN DISPOSABLE) ×2 IMPLANT
GOWN STRL REUS W/ TWL LRG LVL4 (GOWN DISPOSABLE) ×1 IMPLANT
GOWN STRL REUS W/TWL 2XL LVL3 (GOWN DISPOSABLE) ×3 IMPLANT
GOWN STRL REUS W/TWL LRG LVL3 (GOWN DISPOSABLE) ×6
GOWN STRL REUS W/TWL LRG LVL4 (GOWN DISPOSABLE)
HANDPIECE SUCTION TUBG SURGILV (MISCELLANEOUS) ×3 IMPLANT
HOLDER FOLEY CATH W/STRAP (MISCELLANEOUS) ×3 IMPLANT
HOOD PEEL AWAY FACE SHEILD DIS (HOOD) ×6 IMPLANT
KIT RM TURNOVER STRD PROC AR (KITS) ×3 IMPLANT
KNIFE SCULPS 14X20 (INSTRUMENTS) ×3 IMPLANT
NDL SAFETY 18GX1.5 (NEEDLE) ×3 IMPLANT
NDL SPNL 20GX3.5 QUINCKE YW (NEEDLE) ×1 IMPLANT
NEEDLE SPNL 20GX3.5 QUINCKE YW (NEEDLE) ×3 IMPLANT
NS IRRIG 500ML POUR BTL (IV SOLUTION) ×3 IMPLANT
PACK TOTAL KNEE (MISCELLANEOUS) ×3 IMPLANT
PAD GROUND ADULT SPLIT (MISCELLANEOUS) ×3 IMPLANT
PAD WRAPON POLAR KNEE (MISCELLANEOUS) ×1 IMPLANT
PIN FIXATION 1/8DIA X 3INL (PIN) ×3 IMPLANT
SOL .9 NS 3000ML IRR  AL (IV SOLUTION) ×2
SOL .9 NS 3000ML IRR AL (IV SOLUTION) ×1
SOL .9 NS 3000ML IRR UROMATIC (IV SOLUTION) ×1 IMPLANT
SOL PREP PVP 2OZ (MISCELLANEOUS) ×3
SOLUTION PREP PVP 2OZ (MISCELLANEOUS) ×1 IMPLANT
SPONGE DRAIN TRACH 4X4 STRL 2S (GAUZE/BANDAGES/DRESSINGS) ×3 IMPLANT
STAPLER SKIN PROX 35W (STAPLE) ×3 IMPLANT
SUCTION FRAZIER TIP 10 FR DISP (SUCTIONS) ×3 IMPLANT
SUT VIC AB 0 CT1 36 (SUTURE) ×3 IMPLANT
SUT VIC AB 1 CT1 36 (SUTURE) ×6 IMPLANT
SUT VIC AB 2-0 CT2 27 (SUTURE) ×3 IMPLANT
SYR 20CC LL (SYRINGE) ×3 IMPLANT
SYR 30ML LL (SYRINGE) ×3 IMPLANT
SYR 50ML LL SCALE MARK (SYRINGE) ×3 IMPLANT
SYSTEM AUTOTRANSFUS DUAL TROCR (MISCELLANEOUS) ×1 IMPLANT
TOWEL OR 17X26 4PK STRL BLUE (TOWEL DISPOSABLE) ×3 IMPLANT
TOWER CARTRIDGE SMART MIX (DISPOSABLE) ×3 IMPLANT
WRAPON POLAR PAD KNEE (MISCELLANEOUS) ×3

## 2015-08-17 NOTE — Progress Notes (Signed)
Plan of care discussed with patient. Patient states that she would prefer to sleep, not be awakening for pain medication. Patient demonstrates correct use of incentive spirometer. Call bell within reach.

## 2015-08-17 NOTE — Anesthesia Procedure Notes (Signed)
Spinal  Start time: 08/17/2015 12:37 PM End time: 08/17/2015 12:41 PM Staffing Anesthesiologist: Rosaria FerriesPISCITELLO, JOSEPH K Resident/CRNA: Omer JackWEATHERLY, Albert Hersch Performed by: resident/CRNA  Preanesthetic Checklist Completed: patient identified, site marked, surgical consent, pre-op evaluation, timeout performed, IV checked, risks and benefits discussed and monitors and equipment checked Spinal Block Patient position: sitting Prep: Betadine Patient monitoring: heart rate, continuous pulse ox and blood pressure Approach: midline Location: L4-5 Injection technique: single-shot Needle Needle type: Whitacre  Needle gauge: 25 G Needle length: 9 cm Assessment Sensory level: T8

## 2015-08-17 NOTE — Brief Op Note (Signed)
08/17/2015  4:03 PM  PATIENT:  Sheri Barker  81 y.o. female  PRE-OPERATIVE DIAGNOSIS:  DEGENERATIVE arthrosis right knee  POST-OPERATIVE DIAGNOSIS:  DEGENERATIVE arthrosis right knee  PROCEDURE:  Procedure(s): COMPUTER ASSISTED TOTAL KNEE ARTHROPLASTY (Right)  SURGEON:  Surgeon(s) and Role:    * Donato HeinzJames P Kamie Korber, MD - Primary  ASSISTANTS: Van ClinesJon Wolfe, PA    ANESTHESIA:   spinal  EBL:  Total I/O In: 1200 [I.V.:1200] Out: 125 [Urine:100; Blood:25]  BLOOD ADMINISTERED:none  DRAINS: 2 medium drains to reinfusion system   LOCAL MEDICATIONS USED:  MARCAINE    and OTHER Exparel  SPECIMEN:  No Specimen  DISPOSITION OF SPECIMEN:  N/A  COUNTS:  YES  TOURNIQUET:   Total Tourniquet Time Documented: Thigh (Right) - 84 minutes Total: Thigh (Right) - 84 minutes   DICTATION: .Reubin Milanragon Dictation  PLAN OF CARE: Admit to inpatient   PATIENT DISPOSITION:  PACU - hemodynamically stable.   Delay start of Pharmacological VTE agent (>24hrs) due to surgical blood loss or risk of bleeding: yes

## 2015-08-17 NOTE — Anesthesia Preprocedure Evaluation (Signed)
Anesthesia Evaluation  Patient identified by MRN, date of birth, ID band Patient awake    Reviewed: Allergy & Precautions, H&P , NPO status , Patient's Chart, lab work & pertinent test results  History of Anesthesia Complications Negative for: history of anesthetic complications  Airway Mallampati: III  TM Distance: >3 FB Neck ROM: limited    Dental  (+) Poor Dentition   Pulmonary neg pulmonary ROS, neg shortness of breath, neg sleep apnea,    Pulmonary exam normal breath sounds clear to auscultation       Cardiovascular Exercise Tolerance: Good hypertension, (-) angina(-) Past MI and (-) Orthopnea Normal cardiovascular exam Rhythm:regular Rate:Normal     Neuro/Psych PSYCHIATRIC DISORDERS Depression negative neurological ROS     GI/Hepatic negative GI ROS, Neg liver ROS, neg GERD  ,  Endo/Other  Hypothyroidism   Renal/GU negative Renal ROS  negative genitourinary   Musculoskeletal  (+) Arthritis ,   Abdominal   Peds  Hematology negative hematology ROS (+)   Anesthesia Other Findings Past Medical History:   Hypertension                                                 Elevated lipids                                              Depression                                                   Hypothyroidism                                              BMI    Body Mass Index   23.92 kg/m 2      Reproductive/Obstetrics negative OB ROS                             Anesthesia Physical Anesthesia Plan  ASA: III  Anesthesia Plan: Spinal and MAC   Post-op Pain Management:    Induction:   Airway Management Planned:   Additional Equipment:   Intra-op Plan:   Post-operative Plan:   Informed Consent: I have reviewed the patients History and Physical, chart, labs and discussed the procedure including the risks, benefits and alternatives for the proposed anesthesia with the patient or  authorized representative who has indicated his/her understanding and acceptance.   Dental Advisory Given  Plan Discussed with: Anesthesiologist, CRNA and Surgeon  Anesthesia Plan Comments:         Anesthesia Quick Evaluation

## 2015-08-17 NOTE — Anesthesia Postprocedure Evaluation (Signed)
  Anesthesia Post-op Note  Patient: Sheri Barker  Procedure(s) Performed: Procedure(s): COMPUTER ASSISTED TOTAL KNEE ARTHROPLASTY (Right)  Anesthesia type:Spinal, MAC  Patient location: PACU  Post pain: Pain level controlled  Post assessment: Post-op Vital signs reviewed, Patient's Cardiovascular Status Stable, Respiratory Function Stable, Patent Airway and No signs of Nausea or vomiting  Post vital signs: Reviewed and stable  Last Vitals:  Filed Vitals:   08/17/15 1844  BP: 147/55  Pulse: 64  Temp: 36.5 C  Resp: 18    Level of consciousness: awake, alert  and patient cooperative  Complications: No apparent anesthesia complications

## 2015-08-17 NOTE — Op Note (Signed)
OPERATIVE NOTE  DATE OF SURGERY:  08/17/2015  PATIENT NAME:  Sheri Barker   DOB: Jan 15, 1929  MRN: [id number]  PRE-OPERATIVE DIAGNOSIS: Degenerative arthrosis of the right knee, primary  POST-OPERATIVE DIAGNOSIS:  Same  PROCEDURE:  Right total knee arthroplasty using computer-assisted navigation  SURGEON:  Jena Gauss. M.D.  ASSISTANT:  Van Clines, PA (present and scrubbed throughout the case, critical for assistance with exposure, retraction, instrumentation, and closure)  ANESTHESIA: spinal  ESTIMATED BLOOD LOSS: 100 mL  FLUIDS REPLACED: 1200 mL of crystalloid  TOURNIQUET TIME: 84 minutes  DRAINS: 2 medium drains to a reinfusion system  SOFT TISSUE RELEASES: Anterior cruciate ligament, posterior cruciate ligament, deep medial collateral ligament, patellofemoral ligament   IMPLANTS UTILIZED: DePuy PFC Sigma size 2.5 posterior stabilized femoral component (cemented), size 2.5 MBT tibial component (cemented), 35 mm 3 peg oval dome patella (cemented), and a 10 mm stabilized rotating platform polyethylene insert.  INDICATIONS FOR SURGERY: Sheri Barker is a 81 y.o. year old female with a long history of progressive knee pain. X-rays demonstrated severe degenerative changes in tricompartmental fashion. The patient had not seen any significant improvement despite conservative nonsurgical intervention. After discussion of the risks and benefits of surgical intervention, the patient expressed understanding of the risks benefits and agree with plans for total knee arthroplasty.   The risks, benefits, and alternatives were discussed at length including but not limited to the risks of infection, bleeding, nerve injury, stiffness, blood clots, the need for revision surgery, cardiopulmonary complications, among others, and they were willing to proceed.  PROCEDURE IN DETAIL: The patient was brought into the operating room and, after adequate spinal anesthesia was achieved, a tourniquet was  placed on the patient's upper thigh. The patient's knee and leg were cleaned and prepped with alcohol and DuraPrep and draped in the usual sterile fashion. A "timeout" was performed as per usual protocol. The lower extremity was exsanguinated using an Esmarch, and the tourniquet was inflated to 300 mmHg. An anterior longitudinal incision was made followed by a standard mid vastus approach. The deep fibers of the medial collateral ligament were elevated in a subperiosteal fashion off of the medial flare of the tibia so as to maintain a continuous soft tissue sleeve. The patella was subluxed laterally and the patellofemoral ligament was incised. Inspection of the knee demonstrated severe degenerative changes with full-thickness loss of articular cartilage. Osteophytes were debrided using a rongeur. Anterior and posterior cruciate ligaments were excised. Two 4.0 mm Schanz pins were inserted in the femur and into the tibia for attachment of the array of trackers used for computer-assisted navigation. Hip center was identified using a circumduction technique. Distal landmarks were mapped using the computer. The distal femur and proximal tibia were mapped using the computer. The distal femoral cutting guide was positioned using computer-assisted navigation so as to achieve a 5 distal valgus cut. The femur was sized and it was felt that a size 2.5 femoral component was appropriate. A size 2.5 femoral cutting guide was positioned and the anterior cut was performed and verified using the computer. This was followed by completion of the posterior and chamfer cuts. Femoral cutting guide for the central box was then positioned in the center box cut was performed.  Attention was then directed to the proximal tibia. Medial and lateral menisci were excised. The extramedullary tibial cutting guide was positioned using computer-assisted navigation so as to achieve a 0 varus-valgus alignment and 0 posterior slope. The cut was  performed and verified  using the computer. The proximal tibia was sized and it was felt that a size 2.5 tibial tray was appropriate. Tibial and femoral trials were inserted followed by insertion of a 10 mm polyethylene insert. This allowed for excellent mediolateral soft tissue balancing both in flexion and in full extension. Finally, the patella was cut and prepared so as to accommodate a 35 mm 3 peg oval dome patella. A patella trial was placed and the knee was placed through a range of motion with excellent patellar tracking appreciated. The femoral trial was removed after debridement of posterior osteophytes. The central post-hole for the tibial component was reamed followed by insertion of a keel punch. Tibial trials were then removed. Cut surfaces of bone were irrigated with copious amounts of normal saline with antibiotic solution using pulsatile lavage and then suctioned dry. Polymethylmethacrylate cement was prepared in the usual fashion using a vacuum mixer. Cement was applied to the cut surface of the proximal tibia as well as along the undersurface of a size 2.5 MBT tibial component. Tibial component was positioned and impacted into place. Excess cement was removed using Personal assistantreer elevators. Cement was then applied to the cut surfaces of the femur as well as along the posterior flanges of the size 2.5 femoral component. The femoral component was positioned and impacted into place. Excess cement was removed using Personal assistantreer elevators. A 10 mm polyethylene trial was inserted and the knee was brought into full extension with steady axial compression applied. Finally, cement was applied to the backside of a 35 mm 3 peg oval dome patella and the patellar component was positioned and patellar clamp applied. Excess cement was removed using Personal assistantreer elevators. After adequate curing of the cement, the tourniquet was deflated after a total tourniquet time of 84 minutes. Hemostasis was achieved using electrocautery. The knee  was irrigated with copious amounts of normal saline with antibiotic solution using pulsatile lavage and then suctioned dry. 20 mL of 1.3% Exparel in 40 mL of normal saline was injected along the posterior capsule, medial and lateral gutters, and along the arthrotomy site. A 10 mm stabilized rotating platform polyethylene insert was inserted and the knee was placed through a range of motion with excellent mediolateral soft tissue balancing appreciated and excellent patellar tracking noted. 2 medium drains were placed in the wound bed and brought out through separate stab incisions to be attached to a reinfusion system. The medial parapatellar portion of the incision was reapproximated using interrupted sutures of #1 Vicryl. Subcutaneous tissue was then injected with a total of 30 cc of 0.25% Marcaine with epinephrine. Subcutaneous tissue was approximated in layers using first #0 Vicryl followed #2-0 Vicryl. The skin was approximated with skin staples. A sterile dressing was applied.  The patient tolerated the procedure well and was transported to the recovery room in stable condition.    James P. Angie FavaHooten, Jr., M.D.

## 2015-08-17 NOTE — Transfer of Care (Signed)
Immediate Anesthesia Transfer of Care Note  Patient: Sheri Barker  Procedure(s) Performed: Procedure(s): COMPUTER ASSISTED TOTAL KNEE ARTHROPLASTY (Right)  Patient Location: PACU  Anesthesia Type:Spinal  Level of Consciousness: awake, alert  and oriented  Airway & Oxygen Therapy: Patient Spontanous Breathing and Patient connected to nasal cannula oxygen  Post-op Assessment: Report given to RN and Post -op Vital signs reviewed and stable  Post vital signs: Reviewed and stable  Last Vitals:  Filed Vitals:   08/17/15 1602  BP: 119/55  Pulse:   Temp: 36.6 C  Resp: 18    Complications: No apparent anesthesia complications

## 2015-08-17 NOTE — H&P (Signed)
The patient has been re-examined, and the chart reviewed, and there have been no interval changes to the documented history and physical.    The risks, benefits, and alternatives have been discussed at length. The patient expressed understanding of the risks benefits and agreed with plans for surgical intervention.  James P. Hooten, Jr. M.D.    

## 2015-08-18 ENCOUNTER — Encounter: Payer: Self-pay | Admitting: Orthopedic Surgery

## 2015-08-18 ENCOUNTER — Encounter
Admission: RE | Admit: 2015-08-18 | Discharge: 2015-08-18 | Disposition: A | Discharge status: Home / Self Care | Payer: Medicare Other | Source: Ambulatory Visit | Attending: Internal Medicine | Admitting: Internal Medicine

## 2015-08-18 DIAGNOSIS — M1711 Unilateral primary osteoarthritis, right knee: Secondary | ICD-10-CM | POA: Diagnosis not present

## 2015-08-18 LAB — CBC
HEMATOCRIT: 34.3 % — AB (ref 35.0–47.0)
Hemoglobin: 11.4 g/dL — ABNORMAL LOW (ref 12.0–16.0)
MCH: 30.8 pg (ref 26.0–34.0)
MCHC: 33.2 g/dL (ref 32.0–36.0)
MCV: 93 fL (ref 80.0–100.0)
Platelets: 276 10*3/uL (ref 150–440)
RBC: 3.69 MIL/uL — ABNORMAL LOW (ref 3.80–5.20)
RDW: 13.4 % (ref 11.5–14.5)
WBC: 9.1 10*3/uL (ref 3.6–11.0)

## 2015-08-18 LAB — BASIC METABOLIC PANEL
ANION GAP: 6 (ref 5–15)
BUN: 9 mg/dL (ref 6–20)
CO2: 28 mmol/L (ref 22–32)
Calcium: 8.5 mg/dL — ABNORMAL LOW (ref 8.9–10.3)
Chloride: 100 mmol/L — ABNORMAL LOW (ref 101–111)
Creatinine, Ser: 0.5 mg/dL (ref 0.44–1.00)
GFR calc non Af Amer: >60 mL/min (ref >60)
GLUCOSE: 113 mg/dL — AB (ref 65–99)
POTASSIUM: 2.9 mmol/L — AB (ref 3.5–5.1)
Sodium: 134 mmol/L — ABNORMAL LOW (ref 135–145)

## 2015-08-18 MED ORDER — POTASSIUM CHLORIDE 20 MEQ PO PACK
20.0000 meq | PACK | Freq: Three times a day (TID) | ORAL | Status: AC
  Administered 2015-08-18 (×3): 20 meq via ORAL
  Filled 2015-08-18 (×4): qty 1

## 2015-08-18 NOTE — Evaluation (Signed)
Physical Therapy Evaluation Patient Details Name: Sheri Barker MRN: [id number] DOB: Apr 09, 1929 Today's Date: 08/18/2015   History of Present Illness  Patient is a very pleasant 81 y/o female that presents for R TKR.  Clinical Impression  Patient is a very pleasant female presenting for R TKR, she demonstrates decreased independence with bed mobility, transfers, and ambulation tolerance/gait mechanics in this session. Patient demonstrates excellent participation and attitude with therapy, and seems to surprise herself with how capable she is in this session. Patient is able to complete 10 SLRs with RLE at this time, no lag noted. No buckling in ambulation noted, though she did fatigue quickly. Skilled PT services continue to be indicated to address the above deficits.     Follow Up Recommendations Home health PT    Equipment Recommendations  3in1 (PT)    Recommendations for Other Services       Precautions / Restrictions Precautions Precautions: Fall Required Braces or Orthoses: Knee Immobilizer - Right Knee Immobilizer - Right: Discontinue once straight leg raise with < 10 degree lag Restrictions Weight Bearing Restrictions: Yes RLE Weight Bearing: Weight bearing as tolerated Other Position/Activity Restrictions: Able to complete 10 SLRs on RLE      Mobility  Bed Mobility Overal bed mobility: Needs Assistance Bed Mobility: Supine to Sit     Supine to sit: Min guard;Min assist     General bed mobility comments: Patient requires very minimal assistance to bring RLE over the edge of the bed, she is able to turn/rotate her torso via her UEs.   Transfers Overall transfer level: Needs assistance Equipment used: Rolling walker (2 wheeled) Transfers: Sit to/from Stand Sit to Stand: Min assist         General transfer comment: Patient requires cuing for anterior shoulder displacement, and min A x1 for transfer.   Ambulation/Gait Ambulation/Gait assistance: Min  guard Ambulation Distance (Feet): 20 Feet Assistive device: Rolling walker (2 wheeled) Gait Pattern/deviations: Step-to pattern;Decreased step length - right;Decreased step length - left;Narrow base of support   Gait velocity interpretation: Below normal speed for age/gender General Gait Details: Patient is able to take step to gait pattern with significant cuing for sequencing. No loss of balance noted. Step to is preferred at this time for pain control.   Stairs            Wheelchair Mobility    Modified Rankin (Stroke Patients Only)       Balance Overall balance assessment: Needs assistance Sitting-balance support: Feet unsupported Sitting balance-Leahy Scale: Good Sitting balance - Comments: No balance deficits noted.    Standing balance support: Bilateral upper extremity supported Standing balance-Leahy Scale: Fair Standing balance comment: Patient does not demonstrate any loss of balance with turning in ambulation, her slow gait speed is indicative of falls risk.                              Pertinent Vitals/Pain Pain Assessment: 0-10 Pain Score: 4  Pain Location: R knee Pain Descriptors / Indicators: Aching Pain Intervention(s): Limited activity within patient's tolerance;Monitored during session;Repositioned;Patient requesting pain meds-RN notified;Relaxation;Ice applied    Home Living Family/patient expects to be discharged to:: Skilled nursing facility Living Arrangements: Spouse/significant other Available Help at Discharge: Family Type of Home: House Home Access: Stairs to enter Entrance Stairs-Rails: Can reach both Entrance Stairs-Number of Steps: 2 Home Layout: Two level;Able to live on main level with bedroom/bathroom Home Equipment: Dan HumphreysWalker - 2 wheels;Cane - single  point      Prior Function Level of Independence: Independent         Comments: Patient has been very active up until her surgery with no AD.      Hand Dominance         Extremity/Trunk Assessment   Upper Extremity Assessment: Overall WFL for tasks assessed           Lower Extremity Assessment: Overall WFL for tasks assessed (Able to complete LAQs and SLRs with very minimal assistance if at all)         Communication   Communication: No difficulties  Cognition Arousal/Alertness: Awake/alert Behavior During Therapy: WFL for tasks assessed/performed Overall Cognitive Status: Within Functional Limits for tasks assessed                      General Comments      Exercises Total Joint Exercises Ankle Circles/Pumps: AROM;Both;10 reps Gluteal Sets: AROM;Both;10 reps Hip ABduction/ADduction: AROM;10 reps;Both Straight Leg Raises: AROM;10 reps;Both Long Arc Quad: AAROM;10 reps;Right;AROM;Left Goniometric ROM: 2-59      Assessment/Plan    PT Assessment Patient needs continued PT services  PT Diagnosis Difficulty walking;Abnormality of gait   PT Problem List Decreased strength;Decreased mobility;Decreased range of motion;Decreased activity tolerance;Decreased cognition;Decreased balance;Decreased knowledge of use of DME  PT Treatment Interventions Gait training;DME instruction;Therapeutic activities;Therapeutic exercise;Balance training;Stair training   PT Goals (Current goals can be found in the Care Plan section) Acute Rehab PT Goals Patient Stated Goal: To get back to independent ambulation  PT Goal Formulation: With patient Time For Goal Achievement: 09/01/15 Potential to Achieve Goals: Good    Frequency BID   Barriers to discharge Decreased caregiver support      Co-evaluation               End of Session Equipment Utilized During Treatment: Gait belt Activity Tolerance: Patient tolerated treatment well;Patient limited by fatigue Patient left: in chair;with call bell/phone within reach;with nursing/sitter in room;with chair alarm set;with family/visitor present Nurse Communication: Mobility status;Patient  requests pain meds         Time: 9604-5409 PT Time Calculation (min) (ACUTE ONLY): 27 min   Charges:   PT Evaluation $Initial PT Evaluation Tier I: 1 Procedure PT Treatments $Therapeutic Exercise: 8-22 mins   PT G Codes:       Kerin Ransom, PT, DPT    08/18/2015, 12:46 PM

## 2015-08-18 NOTE — Progress Notes (Addendum)
Physical Therapy Treatment Patient Details Name: Sheri Barker MRN: [id number] DOB: 15-Sep-1929 Today's Date: 08/18/2015    History of Present Illness Patient is a very pleasant 81 y/o female that presents for R TKR.    PT Comments    Patient is motivated for PT. She was able to perform AAROM exercises for RLE and AROM exercises for LLE. She transfers with CGA and then was very fatigued and  Had mild nausea. She ambulated 5 feet fwd and 5 feet bwd with Rw and min assist for IV pole and cuing for sequencing. She needed min asssit for sit to supine bed mobility.   Follow Up Recommendations  SNF    Equipment Recommendations  3in1 (PT)    Recommendations for Other Services       Precautions / Restrictions Precautions Precautions: Fall Required Braces or Orthoses: Knee Immobilizer - Right Knee Immobilizer - Right: Discontinue once straight leg raise with < 10 degree lag Restrictions Weight Bearing Restrictions: Yes RLE Weight Bearing: Weight bearing as tolerated Other Position/Activity Restrictions: Able to complete 10 SLRs on RLE    Mobility  Bed Mobility Overal bed mobility: Needs Assistance Bed Mobility: Sit to Supine     Supine to sit: Min guard;Min assist Sit to supine: Min assist   General bed mobility comments: Patient requires very minimal assistance to bring RLE over the edge of the bed, she is able to turn/rotate her torso via her UEs.   Transfers Overall transfer level: Needs assistance Equipment used: Rolling walker (2 wheeled) Transfers: Sit to/from Stand Sit to Stand: Min guard         General transfer comment: Patient requires cuing for anterior shoulder displacement, and min A x1 for transfer.   Ambulation/Gait Ambulation/Gait assistance: Min guard Ambulation Distance (Feet): 10 Feet Assistive device: Rolling walker (2 wheeled) Gait Pattern/deviations: Step-to pattern;Decreased step length - right;Decreased step length - left;Narrow base of  support   Gait velocity interpretation: Below normal speed for age/gender General Gait Details: Patient is able to take step to gait pattern with significant cuing for sequencing. No loss of balance noted. Step to is preferred at this time for pain control.    Stairs            Wheelchair Mobility    Modified Rankin (Stroke Patients Only)       Balance Overall balance assessment: Needs assistance Sitting-balance support: Feet unsupported Sitting balance-Leahy Scale: Good Sitting balance - Comments: No balance deficits noted.    Standing balance support: Bilateral upper extremity supported Standing balance-Leahy Scale: Fair Standing balance comment: Patient does not demonstrate any loss of balance with turning in ambulation, her slow gait speed is indicative of falls risk.                     Cognition Arousal/Alertness: Awake/alert Behavior During Therapy: WFL for tasks assessed/performed Overall Cognitive Status: Within Functional Limits for tasks assessed                      Exercises Total Joint Exercises Ankle Circles/Pumps: AROM;Both;10 reps Gluteal Sets: AROM;Both;10 reps Heel Slides: PROM;10 reps;Right Hip ABduction/ADduction: AAROM;10 reps;Right Straight Leg Raises: AROM;10 reps;Both Long Arc Quad: AAROM;10 reps;Right;AROM;Left Knee Flexion: PROM;Right;10 reps Goniometric ROM: 62 deg flex right knee    General Comments        Pertinent Vitals/Pain Pain Assessment: 0-10 Pain Score: 3  Pain Location: right knee Pain Descriptors / Indicators: Aching Pain Intervention(s): Limited activity within patient's tolerance  Home Living Family/patient expects to be discharged to:: Skilled nursing facility Living Arrangements: Spouse/significant other Available Help at Discharge: Family Type of Home: House Home Access: Stairs to enter Entrance Stairs-Rails: Can reach both Home Layout: Two level;Able to live on main level with  bedroom/bathroom Home Equipment: Dan HumphreysWalker - 2 wheels;Cane - single point      Prior Function Level of Independence: Independent      Comments: Patient has been very active up until her surgery with no AD.    PT Goals (current goals can now be found in the care plan section) Acute Rehab PT Goals Patient Stated Goal: To get back to independent ambulation  PT Goal Formulation: With patient Time For Goal Achievement: 09/01/15 Potential to Achieve Goals: Good Progress towards PT goals: Progressing toward goals    Frequency  BID    PT Plan Current plan remains appropriate    Co-evaluation             End of Session Equipment Utilized During Treatment: Gait belt Activity Tolerance: Patient tolerated treatment well;Patient limited by fatigue Patient left: in bed;with bed alarm set     Time: 1610-96041305-1345 PT Time Calculation (min) (ACUTE ONLY): 40 min  Charges:  $Therapeutic Exercise: 23-37 mins $Therapeutic Activity: 8-22 mins                    G Codes:     Ezekiel InaKristine S Gwenlyn Hottinger, PT, DPT [place], Barkley BrunsKristine S 08/18/2015, 2:01 PM  In addendum, updated d/c recomendations

## 2015-08-18 NOTE — Progress Notes (Addendum)
  Subjective: 1 Day Post-Op Procedure(s) (LRB): COMPUTER ASSISTED TOTAL KNEE ARTHROPLASTY (Right) Patient reports pain as mild.   Patient seen in rounds with Dr. Ernest Barker. Patient is well, and has had no acute complaints or problems Plan is to go Rehab after hospital stay. Negative for chest pain and shortness of breath Fever: no Gastrointestinal: Negative for nausea and vomiting.  Objective: Vital signs in last 24 hours: Temp:  [97 F (36.1 C)-97.8 F (36.6 C)] 97.7 F (36.5 C) (10/13 0507) Pulse Rate:  [64-84] 77 (10/13 0507) Resp:  [14-18] 18 (10/13 0507) BP: (118-155)/(49-72) 130/54 mmHg (10/13 0507) SpO2:  [93 %-100 %] 93 % (10/13 0507) Weight:  [61.236 kg (135 lb)] 61.236 kg (135 lb) (10/12 0954)  Intake/Output from previous day:  Intake/Output Summary (Last 24 hours) at 08/18/15 0628 Last data filed at 08/18/15 0512  Gross per 24 hour  Intake   2475 ml  Output   1295 ml  Net   1180 ml    Intake/Output this shift: Total I/O In: 1085 [I.V.:935; IV Piggyback:150] Out: 1050 [Urine:950; Drains:100]  Labs:  Recent Labs  08/17/15 1025  HGB 14.6    Recent Labs  08/17/15 1025  HCT 43.0    Recent Labs  08/17/15 1025 08/17/15 2049  NA 139  --   K 3.3*  --   CREATININE  --  0.53  GLUCOSE 114*  --    No results for input(s): LABPT, INR in the last 72 hours.   EXAM General - Patient is Alert and Oriented Extremity - Neurovascular intact Dorsiflexion/Plantar flexion intact Compartment soft Dressing/Incision - clean, dry, no drainage. The Hemovac is intact with 150 cc. Motor Function - intact, moving foot and toes well on exam.   Past Medical History  Diagnosis Date  . Hypertension   . Elevated lipids   . Depression   . Hypothyroidism     Assessment/Plan: 1 Day Post-Op Procedure(s) (LRB): COMPUTER ASSISTED TOTAL KNEE ARTHROPLASTY (Right) Active Problems:   Total knee replacement status Hypokalemia  Estimated body mass index is 23.92 kg/(m^2)  as calculated from the following:   Height as of this encounter: 5\' 3"  (1.6 m).   Weight as of this encounter: 61.236 kg (135 lb). Advance diet Up with therapy Will order KlorCon 20mEq TID today to address hypokalemia. DVT Prophylaxis - Lovenox, Foot Pumps and TED hose Weight-Bearing as tolerated to right leg  Sheri Skeensodd Mundy, PA-C Orthopaedic Surgery 08/18/2015, 6:28 AM  Sheri Barker, Jr. M.D.

## 2015-08-18 NOTE — Progress Notes (Signed)
Patient dangled on side of bed. Patient tolerated well.

## 2015-08-18 NOTE — Clinical Social Work Note (Signed)
Clinical Social Work Assessment  Patient Details  Name: Sheri Barker MRN: [id number] Date of Birth: 03/15/1929  Date of referral:  08/18/15               Reason for consult:  Facility Placement                Permission sought to share information with:  Chartered certified accountant granted to share information::  Yes, Verbal Permission Granted  Name::      [place]::   [facility]  Relationship::     Contact Information:     Housing/Transportation Living arrangements for the past 2 months:  [place] of Information:  Patient Patient Interpreter Needed:  None Criminal Activity/Legal Involvement Pertinent to Current Situation/Hospitalization:  No - Comment as needed Significant Relationships:  Adult Children, Spouse Lives with:  Spouse Do you feel safe going back to the place where you live?  Yes Need for family participation in patient care:  Yes (Comment)  Care giving concerns: Patient lives with her husband Al in [place].    Social Worker assessment / plan: Holiday representative (CSW) received SNF consult. PT recommended home health this morning and changed recommendation to SNF this afternoon. CSW met with patient alone at bedside. Patient was alert and oriented and sitting in the bed. CSW introduced self and explained role of CSW department. Patient reported that she lives in [place] with her husband who is very sick. Patient reported that her husband can't take care of her at home. Per patient her daughter and neighbors will take care of husband while she is in rehab. Patient is agreeable to SNF search and prefers [facility].   CSW presented bed offers. Patient chose [facility]. Plan is for patient to D/C to [facility] Saturday 08/20/15. MD will have to complete D/C Summary Friday.   Employment status:  Retired Nurse, adult PT Recommendations:  [place] / Referral to community resources:  [place]  Patient/Family's Response to care: Patient is agreeable to rehab.   Patient/Family's Understanding of and Emotional Response to Diagnosis, Current Treatment, and Prognosis: Patient was pleasant and thanked CSW for visit.   Emotional Assessment Appearance:  Appears stated age Attitude/Demeanor/Rapport:    Affect (typically observed):  Accepting, Adaptable, Pleasant Orientation:  Oriented to Self, Oriented to Place, Oriented to  Time, Oriented to Situation Alcohol / Substance use:  Not Applicable Psych involvement (Current and /or in the community):  No (Comment)  Discharge Needs  Concerns to be addressed:  Discharge Planning Concerns Readmission within the last 30 days:  No Current discharge risk:  Dependent with Mobility Barriers to Discharge:  Continued Medical Work up   Loralyn Freshwater, LCSW 08/18/2015, 4:16 PM

## 2015-08-18 NOTE — Clinical Social Work Placement (Signed)
   CLINICAL SOCIAL WORK PLACEMENT  NOTE  Date:  08/18/2015  Patient Details  Name: Sheri Barker MRN: [id number] Date of Birth: 07/29/1929  Clinical Social Work is seeking post-discharge placement for this patient at the Skilled  Nursing Facility level of care (*CSW will initial, date and re-position this form in  chart as items are completed):  Yes   Patient/family provided with [facility] Clinical Social Work Department's list of facilities offering this level of care within the geographic area requested by the patient (or if unable, by the patient's family).  Yes   Patient/family informed of their freedom to choose among providers that offer the needed level of care, that participate in Medicare, Medicaid or managed care program needed by the patient, have an available bed and are willing to accept the patient.  Yes   Patient/family informed of [facility]'s ownership interest in [facility] and [facility], as well as of the fact that they are under no obligation to receive care at these facilities.  PASRR submitted to EDS on 08/18/15     PASRR number received on 08/18/15     Existing PASRR number confirmed on       FL2 transmitted to all facilities in geographic area requested by pt/family on 08/18/15     FL2 transmitted to all facilities within larger geographic area on       Patient informed that his/her managed care company has contracts with or will negotiate with certain facilities, including the following:        Yes   Patient/family informed of bed offers received.  Patient [facility] bed at  [facility]     Physician recommends and patient chooses bed at      Patient to be transferred to   on  .  Patient to be transferred to facility by       Patient family notified on   of transfer.  Name of family member notified:        PHYSICIAN Please sign FL2     Additional Comment:    _______________________________________________ Sheri Barker, Sheri Poole G,  LCSW 08/18/2015, 4:15 PM

## 2015-08-18 NOTE — Care Management Note (Addendum)
Case Management Note  Patient Details  Name: Sheri Barker MRN: [id number] Date of Birth: Oct 30, 1929  Subjective/Objective:                   Met with patient to discuss discharge planning. She would like to go to [facility] at discharge. She uses [facility]  [phone]. She has a rolling walker but requests a bedside commode in the event she is able to go home at discharge. She would like to use [facility] for HHPT. She lives with her husband in a two-level home but able to stay on first floor. She states her daughter is caring for her husband currently because "he is not well". PT is currently recommending HHPT.  Action/Plan:  List of home health agencies provided to this patient- she picked [place]. Referral made to Tim/Jerry with [facility] for HHPT. Bedside commode requested from [facility] with [facility] if patient returns home. Lovenox 80m # 14 called in to [facility]  [phone] RNCM will continue to follow.   Expected Discharge Date:                  Expected Discharge Plan:     In-House Referral:     Discharge planning Services  CM Consult  Post Acute Care Choice:  Durable Medical Equipment, Home Health Choice offered to:  Patient  DME Arranged:  Bedside commode [facility]  [facility] Status of Service:  In process, will continue to follow  Medicare Important Message Given:    Date Medicare IM Given:    Medicare IM give by:    Date Additional Medicare IM Given:    Additional Medicare Important Message give by:     If discussed at [place] Meet[place], dates discussed:    Additional Comments[facility]. I have notified AGardenthat walker will not be needed at this time.No further RNCM needs.  AMarshell Garfinkel RN 08/18/2015, 1:44 PM

## 2015-08-18 NOTE — Evaluation (Signed)
Occupational Therapy Evaluation Patient Details Name: Sheri Barker MRN: [id number] DOB: 07/17/1929 Today's Date: 08/18/2015    History of Present Illness This patient is an 82 year old female who is post op day 1 knee replacement.   Clinical Impression   This patient is an 81 year old female who came to [facility] for a R total knee replacement.  Patient lives in a 2 story home but can stay on the first floor.  She had been independent with ADL and functional mobility with out assistive device  She now requires assistance and would benefit from Occupational Therapy for ADL/functioal mobility training.      Follow Up Recommendations  SNF    Equipment Recommendations       Recommendations for Other Services       Precautions / Restrictions Precautions Precautions: Fall Required Braces or Orthoses: Knee Immobilizer - Right Knee Immobilizer - Right: Discontinue once straight leg raise with < 10 degree lag Restrictions Weight Bearing Restrictions: Yes RLE Weight Bearing: Weight bearing as tolerated Other Position/Activity Restrictions: Able to complete 10 SLRs on RLE      Mobility Bed Mobility Overal bed mobility: Needs Assistance Bed Mobility: Sit to Supine     Supine to sit: Modified independent (Device/Increase time) Sit to supine: Modified independent (Device/Increase time)   General bed mobility comments: Patient requires very minimal assistance to bring RLE over the edge of the bed, she is able to turn/rotate her torso via her UEs.   Transfers             Balance                            ADL                                         General ADL Comments: Had been independent. Patient practiced techniques for lower body dressing using hip kit with verbal cues and moderate assist using hip kit.     Vision     Perception     Praxis      Pertinent Vitals/Pain Pain Score: 3  Pain Location: right  knee Pain Descriptors / Indicators: Aching Pain Intervention(s): Limited activity within patient's tolerance     Hand Dominance     Extremity/Trunk Assessment Upper Extremity Assessment Upper Extremity Assessment: Overall WFL for tasks assessed   Lower Extremity Assessment Lower Extremity Assessment: Defer to PT evaluation       Communication Communication Communication: No difficulties   Cognition Arousal/Alertness: Awake/alert Behavior During Therapy: WFL for tasks assessed/performed Overall Cognitive Status: Within Functional Limits for tasks assessed                     General Comments       Exercises       Shoulder Instructions      Home Living Family/patient expects to be discharged to:: Skilled nursing facility Living Arrangements: Spouse/significant other Available Help at Discharge: Family Type of Home: House Home Access: Stairs to enter [employer] of Steps: 2 Entrance Stairs-Rails: Can reach both Home Layout: Two level;Able to live on main level with bedroom/bathroom     Bathroom Shower/Tub: Tub/shower unit         Home Equipment: Walker - 2 wheels;Cane - single point  Prior Functioning/Environment Level of Independence: Independent        Comments: no assistive devices.    OT Diagnosis: Acute pain   OT Problem List:     OT Treatment/Interventions:      OT Goals(Current goals can be found in the care plan section) Acute Rehab OT Goals Patient Stated Goal: To get back to independent ambulation   OT Frequency:     Barriers to D/C:            Co-evaluation              End of Session Equipment Utilized During Treatment:  (hip kit)  Activity Tolerance:   Patient left: in bed;with call bell/phone within reach;with bed alarm set   Time: 1423-1444 OT Time Calculation (min): 21 min Charges:  OT General Charges $OT Visit: 1 Procedure OT Evaluation $Initial OT Evaluation Tier I: 1 Procedure OT  Treatments $Self Care/Home Management : 8-22 mins G-Codes:    Myrene Galas, MS/OTR/L  08/18/2015, 2:58 PM

## 2015-08-19 LAB — BASIC METABOLIC PANEL
ANION GAP: 5 (ref 5–15)
BUN: 7 mg/dL (ref 6–20)
CALCIUM: 8.8 mg/dL — AB (ref 8.9–10.3)
CO2: 28 mmol/L (ref 22–32)
Chloride: 103 mmol/L (ref 101–111)
Creatinine, Ser: 0.4 mg/dL — ABNORMAL LOW (ref 0.44–1.00)
GFR calc Af Amer: >60 mL/min (ref >60)
GLUCOSE: 115 mg/dL — AB (ref 65–99)
Potassium: 3.3 mmol/L — ABNORMAL LOW (ref 3.5–5.1)
SODIUM: 136 mmol/L (ref 135–145)

## 2015-08-19 LAB — CBC
HCT: 32.9 % — ABNORMAL LOW (ref 35.0–47.0)
Hemoglobin: 11.2 g/dL — ABNORMAL LOW (ref 12.0–16.0)
MCH: 30.9 pg (ref 26.0–34.0)
MCHC: 34 g/dL (ref 32.0–36.0)
MCV: 90.8 fL (ref 80.0–100.0)
PLATELETS: 255 10*3/uL (ref 150–440)
RBC: 3.62 MIL/uL — ABNORMAL LOW (ref 3.80–5.20)
RDW: 13.7 % (ref 11.5–14.5)
WBC: 8.5 10*3/uL (ref 3.6–11.0)

## 2015-08-19 MED ORDER — TRAMADOL HCL 50 MG PO TABS: 50.0000 mg | ORAL_TABLET | ORAL | Status: DC | PRN

## 2015-08-19 MED ORDER — LACTULOSE 10 GM/15ML PO SOLN
10.0000 g | Freq: Two times a day (BID) | ORAL | Status: DC | PRN
  Administered 2015-08-19: 10 g via ORAL
  Filled 2015-08-19: qty 30

## 2015-08-19 MED ORDER — ENOXAPARIN SODIUM 30 MG/0.3ML ~~LOC~~ SOLN: 30.0000 mg | Freq: Two times a day (BID) | SUBCUTANEOUS | Status: DC

## 2015-08-19 MED ORDER — OXYCODONE HCL 5 MG PO TABS: 5.0000 mg | ORAL_TABLET | ORAL | Status: DC | PRN

## 2015-08-19 NOTE — Progress Notes (Signed)
Physical Therapy Treatment Patient Details Name: Sheri Barker MRN: [id number] DOB: 1928-11-16 Today's Date: 08/19/2015    History of Present Illness  81 y/o female here s/p R TKA 10/12    PT Comments    Pt is able to get into bed and rise from sitting with only VCs today.  She is still limited with how far she can walk secondary to fatigue but generally is progressing nicely.  She is confident with SLRs, has >90 degrees of flexion and has improving cadence and gait.   Follow Up Recommendations  SNF     Equipment Recommendations       Recommendations for Other Services       Precautions / Restrictions Precautions Precautions: Fall Restrictions RLE Weight Bearing: Weight bearing as tolerated    Mobility  Bed Mobility Overal bed mobility: Modified Independent Bed Mobility: Sit to Supine     Supine to sit: Modified independent (Device/Increase time) Sit to supine: Modified independent (Device/Increase time)   General bed mobility comments: Pt was able to lift LEs into the bed w/o assist.  Transfers Overall transfer level: Modified independent Equipment used: Rolling walker (2 wheeled) Transfers: Sit to/from Stand Sit to Stand: Min guard         General transfer comment: Pt able to rise w/o direct assist, does need cues for hand placement/set up  Ambulation/Gait Ambulation/Gait assistance: Modified independent (Device/Increase time) Ambulation Distance (Feet): 60 Feet Assistive device: Rolling walker (2 wheeled)       General Gait Details: Pt with safe but cautious ambulation.  She is not heavily reliant on the walker, maintains forward momentum much of the time and is able to ambulate w/o KI.   Stairs            Wheelchair Mobility    Modified Rankin (Stroke Patients Only)       Balance                                    Cognition Arousal/Alertness: Awake/alert Behavior During Therapy: WFL for tasks  assessed/performed Overall Cognitive Status: Within Functional Limits for tasks assessed                      Exercises Total Joint Exercises Ankle Circles/Pumps: AROM;Both;10 reps Quad Sets: 10 reps;Strengthening Short Arc Quad: 15 reps;AROM Heel Slides: PROM;10 reps;Right Hip ABduction/ADduction: AAROM;10 reps;Right Straight Leg Raises: AROM;10 reps;Both Knee Flexion: 10 reps;PROM;Right Goniometric ROM: 0-81    General Comments        Pertinent Vitals/Pain Pain Assessment: 0-10 Pain Score: 5     Home Living                      Prior Function            PT Goals (current goals can now be found in the care plan section) Progress towards PT goals: Progressing toward goals    Frequency  BID    PT Plan Current plan remains appropriate    Co-evaluation             End of Session Equipment Utilized During Treatment: Gait belt Activity Tolerance: Patient tolerated treatment well Patient left: with bed alarm set     Time: 1445-1513 PT Time Calculation (min) (ACUTE ONLY): 28 min  Charges:  $Gait Training: 8-22 mins $Therapeutic Exercise: 8-22 mins  G Codes:     Loran SentersGalen Ellyssa Zagal, PT, DPT [phone]  Malachi ProGalen R Blandon Offerdahl 08/19/2015, 4:37 PM

## 2015-08-19 NOTE — Discharge Instructions (Signed)

## 2015-08-19 NOTE — Progress Notes (Signed)
Physical Therapy Treatment Patient Details Name: Sheri Barker MRN: [id number] DOB: August 29, 1929 Today's Date: 08/19/2015    History of Present Illness This patient is an 81 year old female who is post op day 1 knee replacement.    PT Comments    Pt is able to ambulate further and with improved confidence today.  She is showing good quad control/strength with SLRs and other exercises.  She is able to get to 80 degrees of flexion and generally is at POD2 goals.   Follow Up Recommendations  SNF;Home health PT (pt does not feel like she could safely go home at this time)     Equipment Recommendations       Recommendations for Other Services       Precautions / Restrictions Precautions Precautions: Fall Restrictions RLE Weight Bearing: Weight bearing as tolerated    Mobility  Bed Mobility Overal bed mobility: Needs Assistance Bed Mobility: Sit to Supine     Supine to sit: Modified independent (Device/Increase time) Sit to supine: Modified independent (Device/Increase time)   General bed mobility comments: Pt did well with getting to EOB, she did not need direct phyiscal assist to get to EOB.  Transfers Overall transfer level: Needs assistance Equipment used: Rolling walker (2 wheeled) Transfers: Sit to/from Stand Sit to Stand: Min guard         General transfer comment: Pt was able to get to standing with light walker use and maintains balance well  Ambulation/Gait Ambulation/Gait assistance: Min guard Ambulation Distance (Feet): 40 Feet Assistive device: Rolling walker (2 wheeled)       General Gait Details: Pt with slow, cautious ambulation.  She is not heavily reliant on the walker and is able to ambulate w/o KI.   Stairs            Wheelchair Mobility    Modified Rankin (Stroke Patients Only)       Balance                                    Cognition Arousal/Alertness: Awake/alert Behavior During Therapy: WFL for tasks  assessed/performed Overall Cognitive Status: Within Functional Limits for tasks assessed                      Exercises Total Joint Exercises Ankle Circles/Pumps: AROM;Both;10 reps Quad Sets: 10 reps;Strengthening Short Arc Quad: 15 reps;AROM Heel Slides: PROM;10 reps;Right Hip ABduction/ADduction: AAROM;10 reps;Right Straight Leg Raises: AROM;10 reps;Both (able to do SLR against light resistance) Goniometric ROM: 0-81    General Comments        Pertinent Vitals/Pain Pain Assessment: 0-10 Pain Score: 7     Home Living                      Prior Function            PT Goals (current goals can now be found in the care plan section) Progress towards PT goals: Progressing toward goals    Frequency  BID    PT Plan Current plan remains appropriate    Co-evaluation             End of Session Equipment Utilized During Treatment: Gait belt Activity Tolerance: Patient tolerated treatment well;Patient limited by fatigue Patient left: with chair alarm set     Time: 4098-1191 PT Time Calculation (min) (ACUTE ONLY): 29 min  Charges:  $Gait Training:  8-22 mins $Therapeutic Exercise: 8-22 mins                    G Codes:     Loran SentersGalen Zehava Turski, [place]PT, DPT [phone]  Malachi ProGalen R Madoline Bhatt 08/19/2015, 2:00 PM

## 2015-08-19 NOTE — Progress Notes (Signed)
Patient doing very well today.  She has had 2 BM's that consisted of several BM's. Night shift will give fleets enema.  Pain managed with oxycodone and tylenol.  Pt moving well with stand by assist

## 2015-08-19 NOTE — Discharge Summary (Signed)
Physician Discharge Summary  Patient ID: Sheri Barker MRN: [id number] DOB/AGE: Nov 14, 1928 81 y.o.  Admit date: 08/17/2015 Discharge date: 10/20/2015  Admission Diagnoses:  DEGENERATIVE OSTEOARTHRITIS   Discharge Diagnoses: Patient Active Problem List   Diagnosis Date Noted  . Total knee replacement status 08/17/2015    Past Medical History  Diagnosis Date  . Hypertension   . Elevated lipids   . Depression   . Hypothyroidism      Transfusion: Autovac transfusions given the first 6 hours postoperatively   Consultants (if any):   case management for placement  Discharged Condition: Improved  Hospital Course: Sheri Barker is an 81 y.o. female who was admitted 08/17/2015 with a diagnosis of degenerative arthrosis right knee and went to the operating room on 08/17/2015 and underwent the above named procedures.    Surgeries:Procedure(s): COMPUTER ASSISTED TOTAL KNEE ARTHROPLASTY on 08/17/2015  PRE-OPERATIVE DIAGNOSIS: Degenerative arthrosis of the right knee, primary  POST-OPERATIVE DIAGNOSIS: Same  PROCEDURE: Right total knee arthroplasty using computer-assisted navigation  SURGEON: Jena GaussJames P Hooten, Jr. M.D.  ASSISTANT: Van ClinesJon Wolfe, PA (present and scrubbed throughout the case, critical for assistance with exposure, retraction, instrumentation, and closure)  ANESTHESIA: spinal  ESTIMATED BLOOD LOSS: 100 mL  FLUIDS REPLACED: 1200 mL of crystalloid  TOURNIQUET TIME: 84 minutes  DRAINS: 2 medium drains to a reinfusion system  SOFT TISSUE RELEASES: Anterior cruciate ligament, posterior cruciate ligament, deep medial collateral ligament, patellofemoral ligament   IMPLANTS UTILIZED: DePuy PFC Sigma size 2.5 posterior stabilized femoral component (cemented), size 2.5 MBT tibial component (cemented), 35 mm 3 peg oval dome patella (cemented), and a 10 mm stabilized rotating platform polyethylene insert.  INDICATIONS FOR SURGERY: Sheri Barker is a 81 y.o. year old  female with a long history of progressive knee pain. X-rays demonstrated severe degenerative changes in tricompartmental fashion. The patient had not seen any significant improvement despite conservative nonsurgical intervention. After discussion of the risks and benefits of surgical intervention, the patient expressed understanding of the risks benefits and agree with plans for total knee arthroplasty.   The risks, benefits, and alternatives were discussed at length including but not limited to the risks of infection, bleeding, nerve injury, stiffness, blood clots, the need for revision surgery, cardiopulmonary complications, among others, and they were willing to proceed. Patient tolerated the surgery well. No complications .Patient was taken to PACU where she was stabilized and then transferred to the orthopedic floor.  Patient started on Lovenox 30 mg q 12 hrs. Foot pumps applied bilaterally at 80 mm hg . Heels elevated off bed with rolled towels. No evidence of DVT. Calves non tender. Negative Homan. Physical therapy started on day #1 for gait training and transfer with OT starting on  day #1 for ADL and assisted devices. Patient has done well with therapy. Ambulated 40 feet upon being discharged.  Patient's IV , foley and hemovac was d/c on day #2.   She was given perioperative antibiotics:  Anti-infectives    Start     Dose/Rate Route Frequency Ordered Stop   08/17/15 1900  clindamycin (CLEOCIN) IVPB 600 mg     600 mg 100 mL/hr over 30 Minutes Intravenous Every 6 hours 08/17/15 1720 08/18/15 1317   08/17/15 0947  clindamycin (CLEOCIN) 600 MG/50ML IVPB    Comments:  Hallaji, Violet Ann: cabinet override      08/17/15 0947 08/17/15 1256   08/17/15 0300  clindamycin (CLEOCIN) IVPB 600 mg  Status:  Discontinued     600 mg 100 mL/hr  over 30 Minutes Intravenous  Once 08/17/15 0258 08/17/15 1716    .  She was fitted with AV 1 compression foot pumps devices, early ambulation, and TED  stockings and Lovenox for DVT prophylaxis. Patient also instructed on ankle pumps  She benefited maximally from the hospital stay and there were no complications.    Recent vital signs:  Filed Vitals:   08/19/15 0514  BP: 144/57  Pulse: 78  Temp: 97.9 F (36.6 C)  Resp: 18    Recent laboratory studies:  Lab Results  Component Value Date   HGB 11.2* 08/19/2015   HGB 11.4* 08/18/2015   HGB 14.6 08/17/2015   Lab Results  Component Value Date   WBC 8.5 08/19/2015   PLT 255 08/19/2015   Lab Results  Component Value Date   INR 0.92 07/27/2015   Lab Results  Component Value Date   NA 136 08/19/2015   K 3.3* 08/19/2015   CL 103 08/19/2015   CO2 28 08/19/2015   BUN 7 08/19/2015   CREATININE 0.40* 08/19/2015   GLUCOSE 115* 08/19/2015    Discharge Medications:     Medication List    STOP taking these medications        aspirin 81 MG tablet      TAKE these medications        acetaminophen 500 MG tablet  Commonly known as:  TYLENOL  Take 500 mg by mouth every 6 (six) hours as needed.     amLODipine-benazepril 10-20 MG capsule  Commonly known as:  LOTREL  Take 1 capsule by mouth daily.     CALTRATE 600+D PO  Take 2 tablets by mouth daily.     CENTRUM SILVER ADULT 50+ PO  Take 1 tablet by mouth daily.     cyclobenzaprine 5 MG tablet  Commonly known as:  FLEXERIL  Take 5 mg by mouth at bedtime as needed for muscle spasms.     DULoxetine 60 MG capsule  Commonly known as:  CYMBALTA  Take 60 mg by mouth daily.     enoxaparin 30 MG/0.3ML injection  Commonly known as:  LOVENOX  Inject 0.3 mLs (30 mg total) into the skin every 12 (twelve) hours.     fluticasone 50 MCG/ACT nasal spray  Commonly known as:  FLONASE  Place 1 spray into both nostrils daily as needed for allergies or rhinitis.     hydrochlorothiazide 25 MG tablet  Commonly known as:  HYDRODIURIL  Take 25 mg by mouth daily.     latanoprost 0.005 % ophthalmic solution  Commonly known as:   XALATAN  Place 1 drop into both eyes at bedtime.     levothyroxine 75 MCG tablet  Commonly known as:  SYNTHROID, LEVOTHROID  Take 75 mcg by mouth daily before breakfast.     meloxicam 15 MG tablet  Commonly known as:  MOBIC  Take 15 mg by mouth daily.     oxyCODONE 5 MG immediate release tablet  Commonly known as:  Oxy IR/ROXICODONE  Take 1-2 tablets (5-10 mg total) by mouth every 4 (four) hours as needed for moderate pain.     RED YEAST RICE PO  Take 2 tablets by mouth daily.     traMADol 50 MG tablet  Commonly known as:  ULTRAM  Take 1-2 tablets (50-100 mg total) by mouth every 4 (four) hours as needed for moderate pain.        Diagnostic Studies: Dg Knee Right Port  08/17/2015  CLINICAL DATA:  Status  post total knee replacement EXAM: PORTABLE RIGHT KNEE - 1-2 VIEW COMPARISON:  None. FINDINGS: Frontal and lateral views were obtained. The patient is status post total knee replacement with femoral and tibial prosthetic components appearing well-seated. No fracture or dislocation. There is a drain within the joint as well as soft tissue air, expected postoperative findings. There old screw tracts noted in the proximal tibia and distal femur. IMPRESSION: Prosthetic components appear well seated. No acute fracture or dislocation. Electronically Signed   By: Bretta Bang III M.D.   On: 08/17/2015 16:40    Disposition: Final discharge disposition not confirmed      Discharge Instructions    Diet - low sodium heart healthy    Complete by:  As directed      Increase activity slowly    Complete by:  As directed            Follow-up Information    Follow up with [facility] R[facility] On 09/01/2015.   Specialty:  Physician Assistant   Why:  at 8:45am   Contact information:   [address] [facility] [place] [address] [phone]       Follow up with Donato Heinz, MD On 10/04/2015.   Specialty:  Orthopedic Surgery   Why:  at 9:15am        Signed: WOLFE,JON R. 08/19/2015, 7:52 AM

## 2015-08-19 NOTE — Progress Notes (Signed)
   Subjective: 2 Days Post-Op Procedure(s) (LRB): COMPUTER ASSISTED TOTAL KNEE ARTHROPLASTY (Right) Patient reports pain as 1 on 0-10 scale.   Patient is well, and has had no acute complaints or problems We will start therapy today.  Plan is to go Rehab after hospital stay. no nausea and vomiting Patient denies any chest pains or shortness of breath. Patient complained of a lot of soreness following her therapy yesterday morning. Unable to do much in the afternoon because of the soreness.  Objective: Vital signs in last 24 hours: Temp:  [97.6 F (36.4 C)-98 F (36.7 C)] 97.9 F (36.6 C) (10/14 0514) Pulse Rate:  [66-80] 78 (10/14 0514) Resp:  [18] 18 (10/14 0514) BP: (119-149)/(47-57) 144/57 mmHg (10/14 0514) SpO2:  [93 %-98 %] 94 % (10/14 0514) well approximated incision Heels are non tender and elevated off the bed using rolled towels Intake/Output from previous day: 10/13 0701 - 10/14 0700 In: 600 [P.O.:600] Out: 1175 [Urine:1050; Drains:125] Intake/Output this shift:     Recent Labs  08/17/15 1025 08/18/15 0622 08/19/15 0612  HGB 14.6 11.4* 11.2*    Recent Labs  08/18/15 0622 08/19/15 0612  WBC 9.1 8.5  RBC 3.69* 3.62*  HCT 34.3* 32.9*  PLT 276 255    Recent Labs  08/17/15 1025 08/17/15 2049 08/18/15 0622  NA 139  --  134*  K 3.3*  --  2.9*  CL  --   --  100*  CO2  --   --  28  BUN  --   --  9  CREATININE  --  0.53 0.50  GLUCOSE 114*  --  113*  CALCIUM  --   --  8.5*   No results for input(s): LABPT, INR in the last 72 hours.  EXAM General - Patient is Alert, Appropriate and Oriented Extremity - Neurologically intact Neurovascular intact Sensation intact distally Intact pulses distally Dorsiflexion/Plantar flexion intact Dressing - scant drainage Motor Function - intact, moving foot and toes well on exam.    Past Medical History  Diagnosis Date  . Hypertension   . Elevated lipids   . Depression   . Hypothyroidism      Assessment/Plan: 2 Days Post-Op Procedure(s) (LRB): COMPUTER ASSISTED TOTAL KNEE ARTHROPLASTY (Right) Active Problems:   Total knee replacement status  Estimated body mass index is 23.92 kg/(m^2) as calculated from the following:   Height as of this encounter: $RemoveBeforeD'5\' 3"'[id number]$  (1.6 m).   Weight as of this encounter: 61.236 kg (135 lb). Up with therapy Plan for discharge tomorrow Discharge to SNF  Labs: were reviewed. STAT met b ordered  DVT Prophylaxis - Lovenox, Foot Pumps and TED hose Weight-Bearing as tolerated to right  Leg Needs to have a bowel movement today Hemovac d/c'd today    Kathelyn Gombos R. [place] 08/19/2015, 7:09 AM

## 2015-08-19 NOTE — Progress Notes (Signed)
Plan is for [facility]. Per Amy [facility]B. RN will call report at [phone]. Clinical Child psychotherapistocial Worker (CSW) sent [facility] Summary via [facility]finder [facility]. [facility]. Patient is aware of above. CSW will continue to follow and assist as needed.   Jetta LoutBailey Morgan, LCSWA [phone]

## 2015-08-19 NOTE — Care Management Important Message (Signed)
Important Message  Patient Details  Name: Sheri Barker MRN: [id number] Date of Birth: 1929/10/04   Medicare Important Message Given:  Yes-second notification given    Olegario MessierKathy A Allmond 08/19/2015, 10:41 AM

## 2015-08-20 LAB — CBC
HEMATOCRIT: 31.9 % — AB (ref 35.0–47.0)
Hemoglobin: 11 g/dL — ABNORMAL LOW (ref 12.0–16.0)
MCH: 31.6 pg (ref 26.0–34.0)
MCHC: 34.4 g/dL (ref 32.0–36.0)
MCV: 91.8 fL (ref 80.0–100.0)
PLATELETS: 258 10*3/uL (ref 150–440)
RBC: 3.48 MIL/uL — ABNORMAL LOW (ref 3.80–5.20)
RDW: 13.6 % (ref 11.5–14.5)
WBC: 8.5 10*3/uL (ref 3.6–11.0)

## 2015-08-20 NOTE — Progress Notes (Signed)
[facility]ed [facility] spoke with Lou CalJennifer Bole, RN.  Called for non emergent transportation.

## 2015-08-20 NOTE — Progress Notes (Signed)
Patient POD 3. Pain controlled with PRN medication. Patient has had a medium BM, fleets enema held. Nursing will continue to monitor. No acute distress noted. Patient resting between care.

## 2015-08-20 NOTE — Clinical Social Work Note (Signed)
[facility] Nurse to call report. Patient and son (nursing stated patient's son is aware) are aware of discharge and in agreement. Patient to transport via EMS. York SpanielMonica Saad Buhl MSW,LCSW [phone]

## 2015-08-20 NOTE — Clinical Social Work Placement (Signed)
   CLINICAL SOCIAL WORK PLACEMENT  NOTE  Date:  08/20/2015  Patient Details  Name: Sheri Barker MRN: [id number] Date of Birth: 10/24/1929  Clinical Social Work is seeking post-discharge placement for this patient at the Skilled  Nursing Facility level of care (*CSW will initial, date and re-position this form in  chart as items are completed):  Yes   Patient/family provided with [facility] Clinical Social Work Department's list of facilities offering this level of care within the geographic area requested by the patient (or if unable, by the patient's family).  Yes   Patient/family informed of their freedom to choose among providers that offer the needed level of care, that participate in Medicare, Medicaid or managed care program needed by the patient, have an available bed and are willing to accept the patient.  Yes   Patient/family informed of [facility]'s ownership interest in [facility] and [facility], as well as of the fact that they are under no obligation to receive care at these facilities.  PASRR submitted to EDS on 08/18/15     PASRR number received on 08/18/15     Existing PASRR number confirmed on       FL2 transmitted to all facilities in geographic area requested by pt/family on 08/18/15     FL2 transmitted to all facilities within larger geographic area on       Patient informed that his/her managed care company has contracts with or will negotiate with certain facilities, including the following:        Yes   Patient/family informed of bed offers received.  Patient [facility] bed at  [facility]     Physician recommends and patient chooses bed at  [facility](SNF)    Patient to be transferred to  [facility] on 08/20/15.  Patient to be transferred to facility by  (EMS)     Patient family notified on   of transfer.  Name of family member notified:   (Nursing stated patient's son is aware of discharge)     PHYSICIAN Please sign FL2     Additional  Comment:    _______________________________________________ York SpanielMonica Kenyan Karnes, LCSW 08/20/2015, 1:01 PM

## 2015-08-20 NOTE — Progress Notes (Signed)
   Subjective: 3 Days Post-Op Procedure(s) (LRB): COMPUTER ASSISTED TOTAL KNEE ARTHROPLASTY (Right) Patient reports pain as 1 on 0-10 scale.   Patient is well, and has had no acute complaints or problems We will continue therapy today.  Plan is to go Rehab today no nausea and vomiting Patient denies any chest pains or shortness of breath.  Objective: Vital signs in last 24 hours: Temp:  [97.4 F (36.3 C)-98.2 F (36.8 C)] 98.2 F (36.8 C) (10/15 0419) Pulse Rate:  [77-92] 89 (10/15 0419) Resp:  [18] 18 (10/15 0419) BP: (136-158)/(47-72) 148/47 mmHg (10/15 0419) SpO2:  [92 %-95 %] 92 % (10/15 0419) well approximated incision Heels are non tender and elevated off the bed using rolled towels Intake/Output from previous day: 10/14 0701 - 10/15 0700 In: 960 [P.O.:960] Out: -  Intake/Output this shift:     Recent Labs  08/17/15 1025 08/18/15 0622 08/19/15 0612 08/20/15 0303  HGB 14.6 11.4* 11.2* 11.0*    Recent Labs  08/19/15 0612 08/20/15 0303  WBC 8.5 8.5  RBC 3.62* 3.48*  HCT 32.9* 31.9*  PLT 255 258    Recent Labs  08/18/15 0622 08/19/15 0612  NA 134* 136  K 2.9* 3.3*  CL 100* 103  CO2 28 28  BUN 9 7  CREATININE 0.50 0.40*  GLUCOSE 113* 115*  CALCIUM 8.5* 8.8*   No results for input(s): LABPT, INR in the last 72 hours.  EXAM General - Patient is Alert, Appropriate and Oriented Extremity - Neurologically intact Neurovascular intact Sensation intact distally Intact pulses distally Dorsiflexion/Plantar flexion intact Dressing - scant drainage Motor Function - intact, moving foot and toes well on exam.    Past Medical History  Diagnosis Date  . Hypertension   . Elevated lipids   . Depression   . Hypothyroidism     Assessment/Plan: 3 Days Post-Op Procedure(s) (LRB): COMPUTER ASSISTED TOTAL KNEE ARTHROPLASTY (Right) Active Problems:   Total knee replacement status  Estimated body mass index is 23.92 kg/(m^2) as calculated from the  following:   Height as of this encounter: $RemoveBeforeD'5\' 3"'[id number]$  (1.6 m).   Weight as of this encounter: 61.236 kg (135 lb). Up with therapy Discharge to SNF  Follow up with [facility] ortho in 2 weeks  Labs: were reviewed. STAT met b ordered  DVT Prophylaxis - Lovenox, Foot Pumps and TED hose Weight-Bearing as tolerated to right  Leg Needs to have a bowel movement today Hemovac d/c'd today    Jon R. [place] 08/20/2015, 7:20 AM

## 2015-08-20 NOTE — Progress Notes (Signed)
Occupational Therapy Treatment Patient Details Name: Sheri Barker MRN: [id number] DOB: 08-20-1929 Today's Date: 08/20/2015    History of present illness     OT comments  Pt show great progress in ADL's and functional mobility to BR - no LOB - pt to be discharge to [facility] - pt do have questions about transfer to bathtub/shower - plans to sleep down stairs first - but has tub shower at home - and later upstairs where walk in shower is. Pt did use sock aid this date to donn socks but some trouble getting sock on because of arthritis changes in hands - pt to cont OT at [facility]   Follow Up Recommendations  SNF    Equipment Recommendations  3 in 1 bedside comode    Recommendations for Other Services      Precautions / Restrictions Restrictions Weight Bearing Restrictions: Yes RLE Weight Bearing: Weight bearing as tolerated       Mobility Bed Mobility                  Transfers                      Balance                                   ADL                                                Vision                     Perception     Praxis      Cognition                             Extremity/Trunk Assessment               Exercises Other Exercises Other Exercises: LB dressing doffing socks by hand Ind with setup - using sock aid to donn socks with min A - has some arthritis changes in hands  Other Exercises: Sit to stand CG , ambulate to BR usinjg walker with close S - no LOB - opening door - toilet transfer without BSC assess -  pt CG to SBA - hygiene Ind with S  Other Exercises: Standing at sick washing hands /grooming Ind with setup - close S - no LOB   Shoulder Instructions       General Comments      Pertinent Vitals/ Pain          Home Living                                          Prior Functioning/Environment              Frequency        Progress Toward Goals  OT Goals(current goals can now be found in the care plan section)  Progress towards OT goals: Progressing toward goals  Acute Rehab OT Goals Patient Stated Goal: To be ind in ADL's and ambulation to go home   Plan Discharge plan remains appropriate  Co-evaluation                 End of Session Equipment Utilized During Treatment: Gait belt;Rolling walker   Activity Tolerance Patient tolerated treatment well   Patient Left in chair;with call bell/phone within reach;with chair alarm set   Nurse Communication          Time: [id number] OT Time Calculation (min): 35 min  Charges: OT General Charges $OT Visit: 1 Procedure OT Treatments $Self Care/Home Management : 23-37 mins  Kurtiss Wence OTR/L,CLT 08/20/2015, 10:50 AM

## 2015-08-20 NOTE — Progress Notes (Signed)
Physical Therapy Treatment Patient Details Name: Sheri Barker MRN: [id number] DOB: 18-Apr-1929 Today's Date: 08/20/2015    History of Present Illness This patient is an 81 year old female who is post op day 1 knee replacement.    PT Comments    Pt continues to work hard with PT and has shown consistent gains with mobility/ambulation/ROM and strength.  She shows improved ambulation today with no pain increase/hesitation with WBing and generally has smooth/consistent gait with light walker use.   Follow Up Recommendations  SNF     Equipment Recommendations       Recommendations for Other Services       Precautions / Restrictions Precautions Precautions: Fall Restrictions RLE Weight Bearing: Weight bearing as tolerated    Mobility  Bed Mobility Overal bed mobility: Modified Independent       Supine to sit: Modified independent (Device/Increase time)     General bed mobility comments: Pt able to get to EOB with greater ease this AM needing less heavy use of rails and shows good sitting balance  Transfers Overall transfer level: Modified independent Equipment used: Rolling walker (2 wheeled) Transfers: Sit to/from Stand Sit to Stand: Min guard         General transfer comment: Pt was able to get to standing w/o assist and actually maintains standing balance w/o UEs with good confidence  Ambulation/Gait Ambulation/Gait assistance: Modified independent (Device/Increase time) Ambulation Distance (Feet): 75 Feet Assistive device: Rolling walker (2 wheeled)       General Gait Details: Pt showing increased confidence and cadence with ambualtion and is able to maintain balance and consistent (non-limping) gait   Stairs            Wheelchair Mobility    Modified Rankin (Stroke Patients Only)       Balance                                    Cognition Arousal/Alertness: Awake/alert Behavior During Therapy: WFL for tasks  assessed/performed Overall Cognitive Status: Within Functional Limits for tasks assessed                      Exercises Total Joint Exercises Ankle Circles/Pumps: AROM;Both;10 reps Quad Sets: Strengthening;15 reps Short Arc Quad: 15 reps;AROM Heel Slides: 10 reps;Right;AAROM;AROM Hip ABduction/ADduction: 15 reps;AROM Straight Leg Raises: AROM;10 reps;Both Goniometric ROM: >90 degrees flexion Other Exercises Other Exercises: LB dressing doffing socks by hand Ind with setup - using sock aid to donn socks with min A - has some arthritis changes in hands  Other Exercises: Sit to stand CG , ambulate to BR usinjg walker with close S - no LOB - opening door - toilet transfer without BSC assess -  pt CG to SBA - hygiene Ind with S  Other Exercises: Standing at sick washing hands /grooming Ind with setup - close S - no LOB    General Comments        Pertinent Vitals/Pain Pain Assessment: 0-10 Pain Score: 4     Home Living                      Prior Function            PT Goals (current goals can now be found in the care plan section) Acute Rehab PT Goals Patient Stated Goal: To be ind in ADL's and ambulation to go home  Progress towards PT goals: Progressing toward goals    Frequency  BID    PT Plan Current plan remains appropriate    Co-evaluation             End of Session Equipment Utilized During Treatment: Gait belt Activity Tolerance: Patient tolerated treatment well Patient left: with chair alarm set     Time: [id number] PT Time Calculation (min) (ACUTE ONLY): 26 min  Charges:  $Gait Training: 8-22 mins $Therapeutic Exercise: 8-22 mins                    G Codes:     Loran SentersGalen Laketia Vicknair, PT, DPT [phone]  Malachi ProGalen R Rozena Fierro 08/20/2015, 12:20 PM

## 2016-07-11 ENCOUNTER — Other Ambulatory Visit: Payer: Self-pay | Admitting: Family Medicine

## 2016-07-11 DIAGNOSIS — Z1231 Encounter for screening mammogram for malignant neoplasm of breast: Secondary | ICD-10-CM

## 2016-07-20 ENCOUNTER — Ambulatory Visit
Admission: RE | Admit: 2016-07-20 | Discharge: 2016-07-20 | Disposition: A | Discharge status: Home / Self Care | Payer: Medicare Other | Source: Ambulatory Visit | Attending: Family Medicine | Admitting: Family Medicine

## 2016-07-20 DIAGNOSIS — Z1231 Encounter for screening mammogram for malignant neoplasm of breast: Secondary | ICD-10-CM

## 2017-05-14 ENCOUNTER — Ambulatory Visit: Payer: Medicare Other | Attending: Family Medicine | Admitting: Physical Therapy

## 2017-05-14 ENCOUNTER — Encounter: Payer: Self-pay | Admitting: Physical Therapy

## 2017-05-14 DIAGNOSIS — M6281 Muscle weakness (generalized): Secondary | ICD-10-CM | POA: Diagnosis present

## 2017-05-14 DIAGNOSIS — R262 Difficulty in walking, not elsewhere classified: Secondary | ICD-10-CM | POA: Insufficient documentation

## 2017-05-14 DIAGNOSIS — M25562 Pain in left knee: Secondary | ICD-10-CM | POA: Diagnosis present

## 2017-05-14 NOTE — Therapy (Signed)
[facility] [address] [place], [place], [address] Phone: [phone]   Fax:  [phone]  Physical Therapy Evaluation  Patient Details  Name: Sheri IronMary W Laris MRN: [id number] Date of Birth: [birth year] Referring Provider: Kandyce RudBabaoff, Marcus MD  Encounter Date: 05/14/2017      PT End of Session - 05/14/17 1429    Visit Number 1   Number of Visits 12   Date for PT Re-Evaluation 06/25/17   Authorization Type 1   Authorization Time Period 10 (G codes)   PT Start Time 1303   PT Stop Time 1410   PT Time Calculation (min) 67 min   Activity Tolerance Patient tolerated treatment well   Behavior During Therapy [facility] for tasks assessed/performed      Past Medical History:  Diagnosis Date  . Depression   . Elevated lipids   . Hypertension   . Hypothyroidism     Past Surgical History:  Procedure Laterality Date  . ABDOMINAL HYSTERECTOMY    . CATARACT EXTRACTION W/ INTRAOCULAR LENS IMPLANT Bilateral   . KNEE ARTHROPLASTY Right 08/17/2015   Procedure: COMPUTER ASSISTED TOTAL KNEE ARTHROPLASTY;  Surgeon: Donato HeinzJames P Hooten, MD;  Location: [facility] ORS;  Service: Orthopedics;  Laterality: Right;  . KNEE ARTHROSCOPY Right   . OSTEOTOMY TOES    . TONSILLECTOMY      There were no vitals filed for this visit.       Subjective Assessment - 05/14/17 1321    Subjective Patient reports she has lateral thigh and calf pain with bending and extending her left knee neither motion reproduces more pain. Limited in climbing stairs, better ascending than descending, She also has pain with sit to stand. She stays in downstairs of a 2 story home.    Pertinent History Patient reports she began to feel pain in left knee, thigh pain following TKA right knee 2016, about a year following her surgery. history of arthritis, hypertension controlled by medication   Limitations Walking;Standing;Other (comment)  bending knee, extending knee   Patient  Stated Goals patient wants to be in better shape with less left leg pain to go on trip in December   Currently in Pain? No/denies  pain ranges from 0/10 up to 7/10            [facility] PT Assessment - 05/14/17 1315      Assessment   Medical Diagnosis Hamstring muscle strain left   Referring Provider Kandyce RudBabaoff, Marcus MD   Onset Date/Surgical Date 08/05/16   Hand Dominance Right   Next MD Visit  6 months   Prior Therapy none for left LE     Balance Screen   Has the patient fallen in the past 6 months No   Has the patient had a decrease in activity level because of a fear of falling?  No   Is the patient reluctant to leave their home because of a fear of falling?  No     Home Environment   Living Environment Private residence   Living Arrangements Alone   Home Access Stairs to enter   Entrance Stairs-Number of Steps 3   Entrance Stairs-Rails Right;Left   Home Layout Two level  patient stays on one level   Home Equipment Eminenceane - single point     Prior Function   Level of Independence Independent   Vocation Works at home   [employer] Requirements housework   Leisure read, family, friends     Cognition  Overall Cognitive Status Within Functional Limits for tasks assessed     Observation/Other Assessments   Lower Extremity Functional Scale  41/80     Sensation   Light Touch Appears Intact     ROM / Strength   AROM / PROM / Strength AROM;Strength     AROM   Overall AROM Comments bilateral LE's hip, knee WNL all planes of motion     Strength   Overall Strength Comments right hip flexion, abduction, extension, ER: 5/5 left LE hip abduction, flexion, extension ER 4-/5     Luisa Hart (FABER) Test   Findings Negative   Side --  bilateral     Thomas Test    Findings Positive   Side Left     Ambulation/Gait   Gait Pattern Trendelenburg  right pelvis drop with weight bearing stance on left   Ambulation Surface Level   Gait velocity WNL      Palpation: grinding in left  knee PF joint with flexion/extension and at fibular head with hip abduction with resistive band  Objective measurements completed on examination: See above findings.           PT Education - 05/14/17 1400    Education provided Yes   Education Details POC: HEP: sitting hip adduction with ball and glute sets, sitting hip abduction with resistive band   Person(s) Educated Patient   Methods Explanation;Demonstration;Verbal cues;Handout   Comprehension Returned demonstration;Verbalized understanding;Verbal cues required             PT Long Term Goals - 05/14/17 1446      PT LONG TERM GOAL #1   Title  Patient will demonstrate improved function with stairs, walking on level and uneven surfaces with LEFS score of 55/80 by 06/25/2017   Baseline LEFS 41/80   Status New     PT LONG TERM GOAL #2   Title Patient will be independent with home program for flexibility, strength by 06/25/2017 to be able to transition to self management by discharge from physical therapy   Baseline limited knowledge of appropriate exercises and progression to improve strength, flexiblity without guidance and cuing   Status New                Plan - 05/14/17 1431    Clinical Impression Statement Patient is an 83 year old female who presents with pain and decreased strength in left LE that has worsened since 08/2016. She has decreased left LE hip strength in abduction, ER and extension and left knee extension and flexion. She has difficulty ascending and descending stairs reciprocally and has decreased balance with single leg stand on left LE. She has limited knowledge of appropriate exercises and progression in order to imrpove strength, function and will benefit from physical therapy intervention.    History and Personal Factors relevant to plan of care: pain, weakness in left LE x 1 year with worsening symptoms into knee, lower leg. History of arthritis and right LE TKA 2016.    Clinical Presentation  Evolving   Clinical Presentation due to: increased weakness, pain in left LE over the past year   Clinical Decision Making Moderate   Rehab Potential Good   Clinical Impairments Affecting Rehab Potential (+)motivated, active PLOF(-)age, comorbidities: arthritis   PT Frequency 2x / week   PT Duration 6 weeks   PT Treatment/Interventions Electrical Stimulation;Cryotherapy;Moist Heat;Patient/family education;Neuromuscular re-education;Therapeutic exercise;Manual techniques   PT Next Visit Plan neuromuscular re education, therapeutic exercise, manual techniques for joint, soft tissue as indicated  PT Home Exercise Plan hip adduction with glute sets, hip abduction sitting with resistive band, diagonal monster walk   Consulted and Agree with Plan of Care Patient      Patient will benefit from skilled therapeutic intervention in order to improve the following deficits and impairments:  Decreased strength, Impaired flexibility, Impaired perceived functional ability, Difficulty walking  Visit Diagnosis: Left knee pain, unspecified chronicity - Plan: PT plan of care cert/re-cert  Muscle weakness (generalized) - Plan: PT plan of care cert/re-cert  Difficulty in walking, not elsewhere classified - Plan: PT plan of care cert/re-cert      G-Codes - 05/27/2017 1443    Functional Assessment Tool Used (Outpatient Only) LEFS, strength, pain, clinical judgment   Functional Limitation Mobility: Walking and moving around   Mobility: Walking and Moving Around Current Status (Z6109) At least 20 percent but less than 40 percent impaired, limited or restricted   Mobility: Walking and Moving Around Goal Status (U0454) At least 1 percent but less than 20 percent impaired, limited or restricted       Problem List Patient Active Problem List   Diagnosis Date Noted  . Total knee replacement status 08/17/2015    Beacher May PT 05/15/2017, 4:51 PM  [facility] PHYSICAL  AND SPORTS MEDICINE [address] [place], [address] Phone: [phone]   Fax:  [phone]  Name: NIKEISHA KLUTZ MRN: [id number] Date of Birth: 1929-10-11

## 2017-05-16 ENCOUNTER — Encounter: Payer: Self-pay | Admitting: Physical Therapy

## 2017-05-16 ENCOUNTER — Ambulatory Visit: Payer: Medicare Other | Admitting: Physical Therapy

## 2017-05-16 DIAGNOSIS — R262 Difficulty in walking, not elsewhere classified: Secondary | ICD-10-CM

## 2017-05-16 DIAGNOSIS — M6281 Muscle weakness (generalized): Secondary | ICD-10-CM

## 2017-05-16 DIAGNOSIS — M25562 Pain in left knee: Secondary | ICD-10-CM | POA: Diagnosis not present

## 2017-05-16 NOTE — Therapy (Signed)
[facility] [address] [place], [address] Phone: [phone]   Fax:  [phone]  Physical Therapy Treatment  Patient Details  Name: Sheri Barker MRN: [id number] Date of Birth: 1928-12-29 Referring Provider: Kandyce Rud MD  Encounter Date: 05/16/2017      PT End of Session - 05/16/17 1210    Visit Number 2   Number of Visits 12   Date for PT Re-Evaluation 06/25/17   Authorization Type 2   Authorization Time Period 10 (G codes)   PT Start Time Apr 10, 1107   PT Stop Time 1200   PT Time Calculation (min) 52 min   Activity Tolerance Patient tolerated treatment well   Behavior During Therapy [facility] for tasks assessed/performed      Past Medical History:  Diagnosis Date  . Depression   . Elevated lipids   . Hypertension   . Hypothyroidism     Past Surgical History:  Procedure Laterality Date  . ABDOMINAL HYSTERECTOMY    . CATARACT EXTRACTION W/ INTRAOCULAR LENS IMPLANT Bilateral   . KNEE ARTHROPLASTY Right 08/17/2015   Procedure: COMPUTER ASSISTED TOTAL KNEE ARTHROPLASTY;  Surgeon: Donato Heinz, MD;  Location: [facility] ORS;  Service: Orthopedics;  Laterality: Right;  . KNEE ARTHROSCOPY Right   . OSTEOTOMY TOES    . TONSILLECTOMY      There were no vitals filed for this visit.      Subjective Assessment - 05/16/17 1110    Subjective Patient reports she is having less grinding in left knee than prior session and is exercising as instructed at home. She reports she is still having pain in posterior thigh intermittently and lateral aspect of left knee.    Pertinent History Patient reports she began to feel pain in left knee, thigh pain following TKA right knee 04/10/2015, about a year following her surgery. history of arthritis, hypertension controlled by medication   Limitations Walking;Standing;Other (comment)  bending knee, extending knee   Patient Stated Goals patient wants to be in better shape with less  left leg pain to go on trip in December   Currently in Pain? No/denies      Objective: Palpation; increased thickness, tender along lateral aspect left knee, LCL, increased tenderness along left hamstring and decreased contraction of lateral hamstring noted with isometric exercises  Treatment: Manual therapy: 15 min. Goal: pain control, improve soft tissue elasticity STM left knee lateral aspect, superficial techniques, hamstring muscles central, lateral hamstring STM superficial techniques and compression techniques; positioned patient sitting and supine lying  Therapeutic exercises: patient performed with instruction, tactile cues, VC and assistance of therapist: goal: independent with home program, pain, strength Sitting:  Hamstring curls with red resistive band x 15 reps Supine hip/knee flexion with stability ball performed 5 reps with isometric hamstring setting at end range 5 reps, 5 second holds Supine hip and knee flexion, extension with isometric hip extension and knee flexion with manual resistance 5 reps each, mild to moderate resistance given with tactile cues to facilitate complete hamstring activation.   Modalities: Moist heat applied to left hamstring with patient supine with left LE supported on pillow x 10 min. Goal: pain, spasms  Patient response to treatment: Patient demonstrated improved technique with exercises and improved muscle activation with moderate tactile cues and with minimal VC for correct alignment. Patient with decreased spasms by >50% following STM. Improved motor control with repetition and cuing.            PT  Education - 05/16/17 1130    Education provided Yes   Education Details HEP re assessed, added stability ball on table for hip/knee flexion and isometric knee flexion;  Self STM left knee instruction   Person(s) Educated Patient   Methods Explanation;Demonstration;Verbal cues;Tactile cues;Handout   Comprehension Verbalized  understanding;Returned demonstration;Verbal cues required;Tactile cues required             PT Long Term Goals - 05/14/17 1446      PT LONG TERM GOAL #1   Title  Patient will demonstrate improved function with stairs, walking on level and uneven surfaces with LEFS score of 55/80 by 06/25/2017   Baseline LEFS 41/80   Status New     PT LONG TERM GOAL #2   Title Patient will be independent with home program for flexibility, strength by 06/25/2017 to be able to transition to self management by discharge from physical therapy   Baseline limited knowledge of appropriate exercises and progression to improve strength, flexiblity without guidance and cuing   Status New               Plan - 05/16/17 1216    Clinical Impression Statement Patient doing well with current treatment and progressing steadily towards all goals. Improvement noted in soft tissue elasticity, pain, strength. Improved ability to ambulate with less discomfort in left LE following treatment. Patient will benefit from continued physical therapy intervention to address limitations and achieve goals.    Rehab Potential Good   Clinical Impairments Affecting Rehab Potential (+)motivated, active PLOF(-)age, comorbidities: arthritis   PT Frequency 2x / week   PT Duration 6 weeks   PT Treatment/Interventions Electrical Stimulation;Cryotherapy;Moist Heat;Patient/family education;Neuromuscular re-education;Therapeutic exercise;Manual techniques   PT Next Visit Plan neurmuscular re education, therapeutic exercise, manual techniques for joint, soft tissue   PT Home Exercise Plan hip adduction with glute sets, hip abduction sitting with resistive band, diagonal monster walk      Patient will benefit from skilled therapeutic intervention in order to improve the following deficits and impairments:  Decreased strength, Impaired flexibility, Impaired perceived functional ability, Difficulty walking  Visit Diagnosis: Left knee  pain, unspecified chronicity  Muscle weakness (generalized)  Difficulty in walking, not elsewhere classified     Problem List Patient Active Problem List   Diagnosis Date Noted  . Total knee replacement status 08/17/2015    Beacher MayBrooks, Kaelynne Christley PT 05/17/2017, 9:13 AM  [facility] [address] [place], [place], [address] Phone: [phone]   Fax:  [phone]  Name: Luiz IronMary W Oriley MRN: [id number] Date of Birth: 20-Jan-1929

## 2017-05-20 ENCOUNTER — Ambulatory Visit: Payer: Medicare Other | Admitting: Physical Therapy

## 2017-05-20 ENCOUNTER — Encounter: Payer: Self-pay | Admitting: Physical Therapy

## 2017-05-20 DIAGNOSIS — M25562 Pain in left knee: Secondary | ICD-10-CM

## 2017-05-20 DIAGNOSIS — M6281 Muscle weakness (generalized): Secondary | ICD-10-CM

## 2017-05-20 DIAGNOSIS — R262 Difficulty in walking, not elsewhere classified: Secondary | ICD-10-CM

## 2017-05-20 NOTE — Therapy (Signed)
[facility] [address] [place], [place], [address] Phone: [phone]   Fax:  [phone]  Physical Therapy Treatment  Patient Details  Name: Luiz IronMary W Schellhorn MRN: [id number] Date of Birth: 15-Apr-1929 Referring Provider: Kandyce RudBabaoff, Marcus MD  Encounter Date: 05/20/2017      PT End of Session - 05/20/17 1255    Visit Number 3   Number of Visits 12   Date for PT Re-Evaluation 06/25/17   Authorization Type 3   Authorization Time Period 10 (G codes)   PT Start Time 1253   PT Stop Time 1343   PT Time Calculation (min) 50 min   Activity Tolerance Patient tolerated treatment well   Behavior [facility] assessed/performed      Past Medical History:  Diagnosis Date  . Depression   . Elevated lipids   . Hypertension   . Hypothyroidism     Past Surgical History:  Procedure Laterality Date  . ABDOMINAL HYSTERECTOMY    . CATARACT EXTRACTION W/ INTRAOCULAR LENS IMPLANT Bilateral   . KNEE ARTHROPLASTY Right 08/17/2015   Procedure: COMPUTER ASSISTED TOTAL KNEE ARTHROPLASTY;  Surgeon: Donato HeinzJames P Hooten, MD;  Location: [facility] ORS;  Service: Orthopedics;  Laterality: Right;  . KNEE ARTHROSCOPY Right   . OSTEOTOMY TOES    . TONSILLECTOMY      There were no vitals filed for this visit.      Subjective Assessment - 05/20/17 1253    Subjective Patient reports she is seeing some improvement in left knee and is exercising as instructed. She reports she is still having difficulty with going up stairs.   Pertinent History Patient reports she began to feel pain in left knee, thigh pain following TKA right knee 2016, about a year following her surgery. history of arthritis, hypertension controlled by medication   Limitations Walking;Standing;Other (comment)  bending knee, extending knee   Patient Stated Goals patient wants to be in better shape with less left leg pain to go on trip in December   Currently in Pain?  No/denies        Objective: Palpation; increased thickness, tender along lateral aspect left knee, LCL, increased tenderness along left hamstring (all improved from previous session)and decreased contraction of lateral hamstring noted with isometric exercises  Treatment: Manual therapy: 10 min. Goal: pain control, improve soft tissue elasticity STM left knee lateral aspect, superficial techniques, hamstring muscles central, lateral hamstring STM superficial techniques and compression techniques; positioned patient sitting   Therapeutic exercises: patient performed with instruction, tactile cues, VC and assistance of therapist: goal: independent with home program, pain, strength Standing:  side stepping on airex balance beam x 2 min. Step ups leading with each LE x 10 Sitting: Hip adduction with ball and glute sets x 10 reps 3 second holds  Modalities: Electrical stimulation: x 20 min.: Russian stim. 10/10 cycle applied (2) electrodes to lateral hamstring left LE with patient reclined with LE supported on pillow; pt. Performing hamstring sets with each cycle; goal muscle re education High volt estim.clincial program for muscle spasms  (2) electrodes applied to lateral quadriceps, intensity to tolerance with patient in reclined position with LE supported on pillow goal: pain, spasms  Moist heat (unbilled) in conjunction with estim: applied to left hamstring with patient supine with left LE supported on pillow x  20 min. Goal: pain, spasms  Patient response to treatment: Patient with improved soft tissue elasticity following STM and improved motor control with repetition and  cuing with all exercises. Improved hamstring setting and decreased lateral quadriceps spasms by 30% following estim.         PT Education - 05/20/17 1254    Education provided Yes   Education Details re assessed home program with ball on table; reinforced hamstring exercises with mild contraction to facilitate  lateral hamstring   Person(s) Educated Patient   Methods Explanation;Demonstration;Verbal cues   Comprehension Verbalized understanding;Returned demonstration;Verbal cues required             PT Long Term Goals - 05/14/17 1446      PT LONG TERM GOAL #1   Title  Patient will demonstrate improved function with stairs, walking on level and uneven surfaces with LEFS score of 55/80 by 06/25/2017   Baseline LEFS 41/80   Status New     PT LONG TERM GOAL #2   Title Patient will be independent with home program for flexibility, strength by 06/25/2017 to be able to transition to self management by discharge from physical therapy   Baseline limited knowledge of appropriate exercises and progression to improve strength, flexiblity without guidance and cuing   Status New               Plan - 05/20/17 1255    Clinical Impression Statement Patient demonstrates steady progress towards goals with improvement noted in decreasing spasms, improving strength, endurance. Patient will benefit from continued physical therapy intervention to address limitations and achieve goals.    Rehab Potential Good   Clinical Impairments Affecting Rehab Potential (+)motivated, active PLOF(-)age, comorbidities: arthritis   PT Frequency 2x / week   PT Duration 6 weeks   PT Treatment/Interventions Electrical Stimulation;Cryotherapy;Moist Heat;Patient/family education;Neuromuscular re-education;Therapeutic exercise;Manual techniques   PT Next Visit Plan neurmuscular re education, therapeutic exercise, manual techniques for joint, soft tissue   PT Home Exercise Plan hip adduction with glute sets, hip abduction sitting with resistive band, diagonal monster walk      Patient will benefit from skilled therapeutic intervention in order to improve the following deficits and impairments:  Decreased strength, Impaired flexibility, Impaired perceived functional ability, Difficulty walking  Visit Diagnosis: Left knee  pain, unspecified chronicity  Muscle weakness (generalized)  Difficulty in walking, not elsewhere classified     Problem List Patient Active Problem List   Diagnosis Date Noted  . Total knee replacement status 08/17/2015    Beacher May PT 05/21/2017, 2:31 PM  [facility] PHYSICAL AND SPORTS MEDICINE [address] [place], [address] Phone: [phone]   Fax:  [phone]  Name: CHARLENA HAUB MRN: [id number] Date of Birth: 1929/05/20

## 2017-05-22 ENCOUNTER — Ambulatory Visit: Payer: Medicare Other | Admitting: Physical Therapy

## 2017-05-27 ENCOUNTER — Ambulatory Visit: Payer: Medicare Other | Admitting: Physical Therapy

## 2017-05-27 ENCOUNTER — Encounter: Payer: Self-pay | Admitting: Physical Therapy

## 2017-05-27 DIAGNOSIS — R262 Difficulty in walking, not elsewhere classified: Secondary | ICD-10-CM

## 2017-05-27 DIAGNOSIS — M25562 Pain in left knee: Secondary | ICD-10-CM | POA: Diagnosis not present

## 2017-05-27 DIAGNOSIS — M6281 Muscle weakness (generalized): Secondary | ICD-10-CM

## 2017-05-27 NOTE — Therapy (Signed)
[facility] [address], [place], [address] Phone: [phone]   Fax:  [phone]  Physical Therapy Treatment  Patient Details  Name: Sheri Barker MRN: [id number] Date of Birth: November 28, 1928 Referring Provider: Kandyce Rud MD  Encounter Date: 05/27/2017      PT End of Session - 05/27/17 1305    Visit Number 4   Number of Visits 12   Date for PT Re-Evaluation 06/25/17   Authorization Type 4   Authorization Time Period 10 (G codes)   PT Start Time 1300   PT Stop Time 1343   PT Time Calculation (min) 43 min   Activity Tolerance Patient tolerated treatment well   Behavior During Therapy [facility] for tasks assessed/performed      Past Medical History:  Diagnosis Date  . Depression   . Elevated lipids   . Hypertension   . Hypothyroidism     Past Surgical History:  Procedure Laterality Date  . ABDOMINAL HYSTERECTOMY    . CATARACT EXTRACTION W/ INTRAOCULAR LENS IMPLANT Bilateral   . KNEE ARTHROPLASTY Right 08/17/2015   Procedure: COMPUTER ASSISTED TOTAL KNEE ARTHROPLASTY;  Surgeon: Donato Heinz, MD;  Location: [facility] ORS;  Service: Orthopedics;  Laterality: Right;  . KNEE ARTHROSCOPY Right   . OSTEOTOMY TOES    . TONSILLECTOMY      There were no vitals filed for this visit.      Subjective Assessment - 05/27/17 1303    Subjective Patient continues to report improvement in left LE and is able to go up steps with less difficulty. She continues with lateral left knee pain that is improving with therapy.    Pertinent History Patient reports she began to feel pain in left knee, thigh pain following TKA right knee 2016, about a year following her surgery. history of arthritis, hypertension controlled by medication   Limitations Walking;Standing;Other (comment)  bending knee, extending knee   Patient Stated Goals patient wants to be in better shape with less left leg pain to go on trip in December    Currently in Pain? Other (Comment)  intermittent on outer side of left knee      Objective: Palpation; increased thickness, tender along lateral aspect left knee, calf muscles, increased spasms along lateral quadriceps, decreased contraction of lateral hamstring noted with isometric exercises  Treatment: Manual therapy:80min. Goal: pain control, improve soft tissue elasticity STM left knee/LE: lateral aspect, hamstring muscles central, lateral, lateral calf muscle and lateral quadriceps STM superficial techniques and compression techniques; positioned patient sitting, supine lying   Therapeutic exercises:patient performed with instruction, tactile cues, VC and assistance of therapist: goal: independent with home program, pain, strength Supine lying: Hip/knee flexion with isometric holds at end ranges 5 reps x 3 sets Standing:  side stepping on airex balance beam x 2 min. Step ups leading with each LE x 10 Sitting: Hip adduction with ball and glute sets x 10 reps 3 second holds  Modalities: Electrical stimulation: x 20 min.: Russian stim. 10/10 cycle applied (2) electrodes to lateral hamstring left LE with patient reclined with LE supported on pillow; pt. Performing hamstring sets with each cycle; goal muscle re education High volt estim.clincial program for muscle spasms  (2) electrodes applied to lateral quadriceps, intensity to tolerance with patient in reclined position with LE supported on pillow goal: pain, spasms  Patient response to treatment: Patient demonstrated improved soft tissue elasticity by 50% following STM. Improved quad control following estim. Required minimal VC  to perform exercises with good technique and motor control.          PT Education - 05/27/17 1305    Education provided Yes   Education Details re assessed exercises with modification of clam in sitting   Person(s) Educated Patient   Methods Explanation   Comprehension Verbalized understanding              PT Long Term Goals - 05/14/17 1446      PT LONG TERM GOAL #1   Title  Patient will demonstrate improved function with stairs, walking on level and uneven surfaces with LEFS score of 55/80 by 06/25/2017   Baseline LEFS 41/80   Status New     PT LONG TERM GOAL #2   Title Patient will be independent with home program for flexibility, strength by 06/25/2017 to be able to transition to self management by discharge from physical therapy   Baseline limited knowledge of appropriate exercises and progression to improve strength, flexiblity without guidance and cuing   Status New               Plan - 05/27/17 1345    Clinical Impression Statement Patient demonstrates improvement with function and able to ascend stairs with less dificulty. Patient demonstrates steady progress towards goals with improvement noted in ROM, strength, endurance. Improved functional use with left LE. Patient will benefit from continued physical therapy intervention to address limitations and achieve goals.    Rehab Potential Good   Clinical Impairments Affecting Rehab Potential (+)motivated, active PLOF(-)age, comorbidities: arthritis   PT Frequency 2x / week   PT Duration 6 weeks   PT Treatment/Interventions Electrical Stimulation;Cryotherapy;Moist Heat;Patient/family education;Neuromuscular re-education;Therapeutic exercise;Manual techniques   PT Next Visit Plan neurmuscular re education, therapeutic exercise, manual techniques for joint, soft tissue   PT Home Exercise Plan hip adduction with glute sets, hip abduction sitting with resistive band, diagonal monster walk      Patient will benefit from skilled therapeutic intervention in order to improve the following deficits and impairments:  Decreased strength, Impaired flexibility, Impaired perceived functional ability, Difficulty walking  Visit Diagnosis: Left knee pain, unspecified chronicity  Muscle weakness (generalized)  Difficulty in  walking, not elsewhere classified     Problem List Patient Active Problem List   Diagnosis Date Noted  . Total knee replacement status 08/17/2015    Beacher MayBrooks, Daun Rens PT 05/28/2017, 2:59 PM   [facility] [address] [place], [place], [address] Phone: [phone]   Fax:  [phone]  Name: Sheri Barker MRN: [id number] Date of Birth: [birth year]

## 2017-05-30 ENCOUNTER — Encounter: Payer: Medicare Other | Admitting: Physical Therapy

## 2017-06-03 ENCOUNTER — Encounter: Payer: Self-pay | Admitting: Physical Therapy

## 2017-06-03 ENCOUNTER — Ambulatory Visit: Payer: Medicare Other | Admitting: Physical Therapy

## 2017-06-03 DIAGNOSIS — M6281 Muscle weakness (generalized): Secondary | ICD-10-CM

## 2017-06-03 DIAGNOSIS — M25562 Pain in left knee: Secondary | ICD-10-CM | POA: Diagnosis not present

## 2017-06-03 DIAGNOSIS — R262 Difficulty in walking, not elsewhere classified: Secondary | ICD-10-CM

## 2017-06-03 NOTE — Therapy (Signed)
[facility] [address] [place], [place], [address] Phone: [phone]   Fax:  [phone]  Physical Therapy Treatment  Patient Details  Name: Sheri Barker MRN: [id number] Date of Birth: 05-16-1929 Referring Provider: Kandyce RudBabaoff, Marcus MD  Encounter Date: 06/03/2017      PT End of Session - 06/03/17 1306    Visit Number 5   Number of Visits 12   Date for PT Re-Evaluation 06/25/17   Authorization Type 5   Authorization Time Period 10 (G codes)   PT Start Time 1302   PT Stop Time 1345   PT Time Calculation (min) 43 min   Activity Tolerance Patient tolerated treatment well   Behavior During Therapy [facility] for tasks assessed/performed      Past Medical History:  Diagnosis Date  . Depression   . Elevated lipids   . Hypertension   . Hypothyroidism     Past Surgical History:  Procedure Laterality Date  . ABDOMINAL HYSTERECTOMY    . CATARACT EXTRACTION W/ INTRAOCULAR LENS IMPLANT Bilateral   . KNEE ARTHROPLASTY Right 08/17/2015   Procedure: COMPUTER ASSISTED TOTAL KNEE ARTHROPLASTY;  Surgeon: Donato HeinzJames P Hooten, MD;  Location: [facility] ORS;  Service: Orthopedics;  Laterality: Right;  . KNEE ARTHROSCOPY Right   . OSTEOTOMY TOES    . TONSILLECTOMY      There were no vitals filed for this visit.      Subjective Assessment - 06/03/17 1304    Subjective Patient continues to report improvement in left LE and is able to go up steps with less difficulty. She would like to use her TENS unit at home is possible.    Pertinent History Patient reports she began to feel pain in left knee, thigh pain following TKA right knee 2016, about a year following her surgery. history of arthritis, hypertension controlled by medication   Limitations Walking;Standing;Other (comment)  bending knee, extending knee   Patient Stated Goals patient wants to be in better shape with less left leg pain to go on trip in December   Currently in Pain?  Other (Comment)  intermittent pain lateral aspect of knee        Objective: Palpation; increased thickness, tender along lateral aspect left knee, calf muscles, increased spasms along lateral quadriceps, decreased contraction of lateral hamstring noted with isometric exercises  Treatment: Manual therapy:1313min. Goal: pain control, improve soft tissue elasticity STM left knee/LE: lateral aspect, hamstring muscles central and lateral quadriceps STM superficial techniques and compression techniques; positioned patient sitting, supine lying    Modalities: Electrical stimulation: x 20 min.: Russian stim. 10/10 cycle applied (2) electrodes to lateral hamstring left LE and (2) electrodes to quadriceps/VMO with patient reclined with LE supported on pillow; pt. Performing quadsets with each cycle; goal muscle re education Ultrasound x 10 min. Performed to lateral aspect of left knee with patient supine with LE supported on pillow: 1MHz pulsed 50% 1.1w/cm2 goal: pain   Patient response to treatment: Patient able to flex, extend left knee without pain in lateral aspect of knee following US, improved muscle control, contraction following estim., improved soft tissue mobility, decreased thickness in soft tissue by 50% following STM.         PT Education - 06/03/17 1305    Education provided Yes   Education Details re assessed exercises with bridging    Person(s) Educated Patient   Methods Explanation   Comprehension Verbalized understanding  PT Long Term Goals - 05/14/17 1446      PT LONG TERM GOAL #1   Title  Patient will demonstrate improved function with stairs, walking on level and uneven surfaces with LEFS score of 55/80 by 06/25/2017   Baseline LEFS 41/80   Status New     PT LONG TERM GOAL #2   Title Patient will be independent with home program for flexibility, strength by 06/25/2017 to be able to transition to self management by discharge from physical therapy    Baseline limited knowledge of appropriate exercises and progression to improve strength, flexiblity without guidance and cuing   Status New               Plan - 06/03/17 1345    Clinical Impression Statement Patient is progressing steadily with decreasing pain and improvement noted with walking and climbing stairs. she continues with lateral left knee pain and should continue to improve with additional physical therpay intervention.    Rehab Potential Good   Clinical Impairments Affecting Rehab Potential (+)motivated, active PLOF(-)age, comorbidities: arthritis   PT Frequency 2x / week   PT Duration 6 weeks   PT Treatment/Interventions Electrical Stimulation;Cryotherapy;Moist Heat;Patient/family education;Neuromuscular re-education;Therapeutic exercise;Manual techniques   PT Next Visit Plan neurmuscular re education, therapeutic exercise, manual techniques for joint, soft tissue   PT Home Exercise Plan hip adduction with glute sets, hip abduction sitting with resistive band, diagonal monster walk      Patient will benefit from skilled therapeutic intervention in order to improve the following deficits and impairments:  Decreased strength, Impaired flexibility, Impaired perceived functional ability, Difficulty walking  Visit Diagnosis: Left knee pain, unspecified chronicity  Muscle weakness (generalized)  Difficulty in walking, not elsewhere classified     Problem List Patient Active Problem List   Diagnosis Date Noted  . Total knee replacement status 08/17/2015    Beacher MayBrooks, De Jaworski PT 06/04/2017, 10:09 AM  [facility] [address] [place], [place], [address] Phone: [phone]   Fax:  [phone]  Name: Sheri Barker MRN: [id number] Date of Birth: 08/04/1929

## 2017-06-05 ENCOUNTER — Encounter: Payer: Medicare Other | Admitting: Physical Therapy

## 2017-06-11 ENCOUNTER — Encounter: Payer: Self-pay | Admitting: Physical Therapy

## 2017-06-11 ENCOUNTER — Ambulatory Visit: Payer: Medicare Other | Attending: Family Medicine | Admitting: Physical Therapy

## 2017-06-11 DIAGNOSIS — M25562 Pain in left knee: Secondary | ICD-10-CM | POA: Insufficient documentation

## 2017-06-11 DIAGNOSIS — M6281 Muscle weakness (generalized): Secondary | ICD-10-CM | POA: Insufficient documentation

## 2017-06-11 DIAGNOSIS — R262 Difficulty in walking, not elsewhere classified: Secondary | ICD-10-CM | POA: Diagnosis present

## 2017-06-11 NOTE — Therapy (Signed)
[place] [facility] PHYSICAL AND SPORTS MEDICINE [address], [place], [address] Phone: [phone]   Fax:  [phone]  Physical Therapy Treatment  Patient Details  Name: Sheri Barker MRN: [id number] Date of Birth: 07-24-1929 Referring Provider: Kandyce Rud MD  Encounter Date: 06/11/2017      PT End of Session - 06/11/17 1420    Visit Number 6   Number of Visits 12   Date for PT Re-Evaluation 06/25/17   Authorization Type 6   Authorization Time Period 10 (G codes)   PT Start Time 1344   PT Stop Time 1425   PT Time Calculation (min) 41 min   Activity Tolerance Patient tolerated treatment well   Behavior During Therapy [facility] for tasks assessed/performed      Past Medical History:  Diagnosis Date  . Depression   . Elevated lipids   . Hypertension   . Hypothyroidism     Past Surgical History:  Procedure Laterality Date  . ABDOMINAL HYSTERECTOMY    . CATARACT EXTRACTION W/ INTRAOCULAR LENS IMPLANT Bilateral   . KNEE ARTHROPLASTY Right 08/17/2015   Procedure: COMPUTER ASSISTED TOTAL KNEE ARTHROPLASTY;  Surgeon: Donato Heinz, MD;  Location: [facility] ORS;  Service: Orthopedics;  Laterality: Right;  . KNEE ARTHROSCOPY Right   . OSTEOTOMY TOES    . TONSILLECTOMY      There were no vitals filed for this visit.      Subjective Assessment - 06/11/17 1348    Subjective Patient continues to see improvement in left LE lateral aspect of her knee with less pain and improving function. she continues with intermittent popping in knee and she is now able to transfer sit to stand with less difficulty and pain.    Pertinent History Patient reports she began to feel pain in left knee, thigh pain following TKA right knee 2016, about a year following her surgery. history of arthritis, hypertension controlled by medication   Limitations Walking;Standing;Other (comment)  bending knee, extending knee   Patient Stated Goals patient wants to be in better  shape with less left leg pain to go on trip in December         Objective: Palpation; increased thickness, tender along lateral aspect left knee increased spasms along lateral quadriceps  Treatment: Manual therapy:59min. Goal: pain control, improve soft tissue elasticity STM left knee/LE:lateral aspect, hamstring muscles, lateral quadricepsSTM superficial techniques and compression techniques; positioned patient supine lying  Modalities: Electrical stimulation: x 15 min.: Russian stim. 10/10 cycle applied (2) electrodes to quadriceps/VMO and high volt with (2) electrodes applied to lateral aspect of left knee and lateral quadriceps, intensity to tolerance with patient reclined with LE supported on pillow; pt. performing quadsets with each cycle; goal muscle re education Ultrasound x 10 min. Performed to lateral aspect of left knee with patient supine with LE supported on pillow: pulsed 50% 1.1w/cm2:  goal: pain  Patient response to treatment: Patient reported no pain in left knee at end of session and demonstrated 50% decreased tenderness and thickness in soft tissue lateral aspect of left knee and quadriceps muscle.           PT Education - 06/11/17 1430    Education provided Yes   Education Details instruction in home exercises to continue   Person(s) Educated Patient   Methods Explanation   Comprehension Verbalized understanding             PT Long Term Goals - 05/14/17 1446  PT LONG TERM GOAL #1   Title  Patient will demonstrate improved function with stairs, walking on level and uneven surfaces with LEFS score of 55/80 by 06/25/2017   Baseline LEFS 41/80   Status New     PT LONG TERM GOAL #2   Title Patient will be independent with home program for flexibility, strength by 06/25/2017 to be able to transition to self management by discharge from physical therapy   Baseline limited knowledge of appropriate exercises and progression to improve  strength, flexiblity without guidance and cuing   Status New               Plan - 06/11/17 1432    Clinical Impression Statement Patient is responding well to current treatment of US/estim. and guided exercises with decreasing pain in left knee and improving function with transfers sit to stand and walking.    Rehab Potential Good   Clinical Impairments Affecting Rehab Potential (+)motivated, active PLOF(-)age, comorbidities: arthritis   PT Frequency 2x / week   PT Duration 6 weeks   PT Treatment/Interventions Electrical Stimulation;Cryotherapy;Moist Heat;Patient/family education;Neuromuscular re-education;Therapeutic exercise;Manual techniques   PT Next Visit Plan neurmuscular re education, therapeutic exercise, manual techniques for joint, soft tissue   PT Home Exercise Plan hip adduction with glute sets, hip abduction sitting with resistive band, diagonal monster walk      Patient will benefit from skilled therapeutic intervention in order to improve the following deficits and impairments:  Decreased strength, Impaired flexibility, Impaired perceived functional ability, Difficulty walking  Visit Diagnosis: Left knee pain, unspecified chronicity  Muscle weakness (generalized)  Difficulty in walking, not elsewhere classified     Problem List Patient Active Problem List   Diagnosis Date Noted  . Total knee replacement status 08/17/2015    Beacher MayBrooks, Marie PT 06/12/2017, 6:45 PM  [facility] [address] [place], [place], [address] Phone: [phone]   Fax:  [phone]  Name: Luiz IronMary W Girardin MRN: [id number] Date of Birth: [birth year]

## 2017-06-13 ENCOUNTER — Encounter: Payer: Medicare Other | Admitting: Physical Therapy

## 2017-06-18 ENCOUNTER — Encounter: Payer: Self-pay | Admitting: Physical Therapy

## 2017-06-18 ENCOUNTER — Ambulatory Visit: Payer: Medicare Other | Admitting: Physical Therapy

## 2017-06-18 DIAGNOSIS — M6281 Muscle weakness (generalized): Secondary | ICD-10-CM

## 2017-06-18 DIAGNOSIS — M25562 Pain in left knee: Secondary | ICD-10-CM | POA: Diagnosis not present

## 2017-06-18 DIAGNOSIS — R262 Difficulty in walking, not elsewhere classified: Secondary | ICD-10-CM

## 2017-06-18 NOTE — Therapy (Signed)
[place] [facility] PHYS[facility]ICINE [address] [place], [place], [address] Phone: [phone]   Fax:  [phone]  Physical Therapy Treatment  Patient Details  Name: Sheri Barker MRN: [id number] Date of Birth: [birth year] Referring Provider: Kandyce RudBabaoff, Marcus MD  Encounter Date: 06/18/2017      PT End of Session - 06/18/17 1017    Visit Number 7   Number of Visits 12   Date for PT Re-Evaluation 06/25/17   Authorization Type 7   Authorization Time Period 10 (G codes)   PT Start Time 0950   PT Stop Time 1040   PT Time Calculation (min) 50 min   Activity Tolerance Patient tolerated treatment well   Behavior [facility] for tasks assessed/performed      Past Medical History:  Diagnosis Date  . Depression   . Elevated lipids   . Hypertension   . Hypothyroidism     Past Surgical History:  Procedure Laterality Date  . ABDOMINAL HYSTERECTOMY    . CATARACT EXTRACTION W/ INTRAOCULAR LENS IMPLANT Bilateral   . KNEE ARTHROPLASTY Right 08/17/2015   Procedure: COMPUTER ASSISTED TOTAL KNEE ARTHROPLASTY;  Surgeon: Donato HeinzJames P Hooten, MD;  Location: [facility] ORS;  Service: Orthopedics;  Laterality: Right;  . KNEE ARTHROSCOPY Right   . OSTEOTOMY TOES    . TONSILLECTOMY      There were no vitals filed for this visit.      Subjective Assessment - 06/18/17 0951    Subjective Patient continues to see improvement and maximal pain level 3/10; today on arrival 0/10   Pertinent History Patient reports she began to feel pain in left knee, thigh pain following TKA right knee 2016, about a year following her surgery. history of arthritis, hypertension controlled by medication   Limitations Walking;Standing;Other (comment)  bending knee, extending knee   Patient Stated Goals patient wants to be in better shape with less left leg pain to go on trip in December   Currently in Pain? Other (Comment)  intermittent pain with increased stress up to 3/10        Objective: Palpation; increased thickness, tender along lateral aspect left knee increased spasms along lateral quadriceps  Treatment: Manual therapy:2023min. Goal: pain control, improve soft tissue elasticity STM left knee/LE:lateral aspect, hamstring muscles, lateral quadricepsSTM superficial techniques and compression techniques; positioned patient supine lying  Modalities: Electrical stimulation: x 20 min.: Russian stim. 10/10 cycle applied (2) electrodes to quadriceps/VMO and high volt with (2) electrodes applied to lateral aspect of left knee and lateral quadriceps, intensity to tolerance with patient reclined with LE supported on pillow; pt. performing quadsets with each cycle; goal muscle re education  Therapeutic exercise: with VC, tactile cues, demonstration of therapist: goal; iimprove function, strength, independent with HEP Re assessed bridging with ball between knees 3 reps, without ball x 2 reps with activation of hamstrings improved left LE from previous sessions  instructed patient in getting in/out of SUV by sitting in seat first and then swinging LE's in, utilizing running board to help raise herself into vehicle; patient performed 2x with VC  Patient response to treatment: Patient demonstrated decreased tenderness in lateral aspect of left knee and improved function with sit to stand and walking at end of session, improved soft tissue elasticity >50% with STM.       PT Education - 06/18/17 1017    Education provided Yes   Education Details instruction in bridging and to continue with currrent program as instructed  Person(s) Educated Patient   Methods Explanation;Demonstration   Comprehension Verbalized understanding             PT Long Term Goals - 05/14/17 1446      PT LONG TERM GOAL #1   Title  Patient will demonstrate improved function with stairs, walking on level and uneven surfaces with LEFS score of 55/80 by 06/25/2017   Baseline LEFS  41/80   Status New     PT LONG TERM GOAL #2   Title Patient will be independent with home program for flexibility, strength by 06/25/2017 to be able to transition to self management by discharge from physical therapy   Baseline limited knowledge of appropriate exercises and progression to improve strength, flexiblity without guidance and cuing   Status New               Plan - 06/18/17 1018    Clinical Impression Statement Patient demonstrates steady progress towards goals with improvement noted in ROM, strength,functional use left LE. She continues with decreased strength, lateral knee pain with certain activity such as getting into car/SUV.  Patient will benefit from continued physical therapy intervention to address limitations and achieve goals.    Rehab Potential Good   Clinical Impairments Affecting Rehab Potential (+)motivated, active PLOF(-)age, comorbidities: arthritis   PT Frequency 2x / week   PT Duration 6 weeks   PT Treatment/Interventions Electrical Stimulation;Cryotherapy;Moist Heat;Patient/family education;Neuromuscular re-education;Therapeutic exercise;Manual techniques   PT Next Visit Plan neurmuscular re education, therapeutic exercise, manual techniques for joint, soft tissue   PT Home Exercise Plan hip adduction with glute sets, hip abduction sitting with resistive band, diagonal monster walk      Patient will benefit from skilled therapeutic intervention in order to improve the following deficits and impairments:  Decreased strength, Impaired flexibility, Impaired perceived functional ability, Difficulty walking  Visit Diagnosis: Left knee pain, unspecified chronicity  Muscle weakness (generalized)  Difficulty in walking, not elsewhere classified     Problem List Patient Active Problem List   Diagnosis Date Noted  . Total knee replacement status 08/17/2015    Beacher May PT 06/18/2017, 10:44 AM  [facility] [facility]  PHYSICAL AND SPORTS MEDICINE [address], [place], [address] Phone: [phone]   Fax:  [phone]  Name: Sheri Barker MRN: [id number] Date of Birth: 11-19-1928

## 2017-06-25 ENCOUNTER — Encounter: Payer: Self-pay | Admitting: Physical Therapy

## 2017-06-25 ENCOUNTER — Ambulatory Visit: Payer: Medicare Other | Admitting: Physical Therapy

## 2017-06-25 DIAGNOSIS — R262 Difficulty in walking, not elsewhere classified: Secondary | ICD-10-CM

## 2017-06-25 DIAGNOSIS — M25562 Pain in left knee: Secondary | ICD-10-CM

## 2017-06-25 DIAGNOSIS — M6281 Muscle weakness (generalized): Secondary | ICD-10-CM

## 2017-06-25 NOTE — Therapy (Signed)
[place] [facility] PHYS[facility]TS MEDICINE [address] [place], [address] Phone: [phone]   Fax:  [phone]  Physical Therapy Treatment/Discharge Summary  Patient Details  Name: Sheri Barker MRN: [id number] Date of Birth: [birth year] Referring Provider: Kandyce Rud MD  Encounter Date: 06/25/2017   Patient began physical therapy on 05/14/2017 and has attended 8 sessions through 06/25/2017. She has achieved all goals and is independent in home program for continued self management of pain/symptoms and exercises as instructed. Plan discharge from physical therapy at this time.        PT End of Session - 06/25/17 1041    Visit Number 8   Number of Visits 12   Date for PT Re-Evaluation 06/25/17   Authorization Type 8   Authorization Time Period 10 (G codes)   PT Start Time 1035   PT Stop Time 1120   PT Time Calculation (min) 45 min   Activity Tolerance Patient tolerated treatment well   Behavior During Therapy [facility] for tasks assessed/performed      Past Medical History:  Diagnosis Date  . Depression   . Elevated lipids   . Hypertension   . Hypothyroidism     Past Surgical History:  Procedure Laterality Date  . ABDOMINAL HYSTERECTOMY    . CATARACT EXTRACTION W/ INTRAOCULAR LENS IMPLANT Bilateral   . KNEE ARTHROPLASTY Right 08/17/2015   Procedure: COMPUTER ASSISTED TOTAL KNEE ARTHROPLASTY;  Surgeon: Donato Heinz, MD;  Location: [facility] ORS;  Service: Orthopedics;  Laterality: Right;  . KNEE ARTHROSCOPY Right   . OSTEOTOMY TOES    . TONSILLECTOMY      There were no vitals filed for this visit.      Subjective Assessment - 06/25/17 1036    Subjective Patient reports she is having less pain with all functional activies and even climbing stairs is improving. intermittent pain only with certain movements.    Pertinent History Patient reports she began to feel pain in left knee, thigh pain following TKA right knee 2016, about  a year following her surgery. history of arthritis, hypertension controlled by medication   Limitations Walking;Standing;Other (comment)  bending knee, extending knee   Patient Stated Goals patient wants to be in better shape with less left leg pain to go on trip in December   Currently in Pain? Other (Comment)  intermittent up to 3/10 with certain movements, no sharp pains now        Objective: Palpation; mild tenderness along lateral aspect left knee increased spasms along lateral quadriceps Outcome measure; LEFS 54/80; 5x sit to stand 13.5 seconds Strength: left LE hip flexion, abduction, knee extension, flexion 4/5  Treatment: Manual therapy:34min. Goal: pain control, improve soft tissue elasticity STM left knee/LE:lateral aspect, hamstring muscles, lateral quadricepsSTM superficial techniques and compression techniques; positioned patient supine lying  Modalities: Electrical stimulation: x .: Russian stim. 10/10 cycle applied (2) electrodes to quadriceps/VMO and high volt with (2) electrodes applied to lateral aspect of left knee and lateral quadriceps, intensity to tolerancewith patient reclined with LE supported on pillow; pt. performing quadsets with each cycle; goal muscle re education Ultrasound pulsed 50% @ 1.2w/cm2 x 10 min. Lateral aspect left knee and lateral quadriceps with patient supine with left LE supported on pillow  Therapeutic exercise: with VC, tactile cues, demonstration of therapist: goal; iimprove function, strength, independent with HEP Re assessed home exercises to continue:  Bridging with ball Sit to stand from elevated surface: performed with (2) 4# dumbbells x  5 reps without popping in either knee, improved alignment with ball between knees 5x sit to stand from 17" chair without use of hands: 13.5 seconds; demonstrated good strength in LE's  Stairs reciprocal ascending/descending: instructed in proper alignment of LE's without medial  collapse of left knee: demonstrated and patient returned demonstration with verbal understanding.   Patient response to treatment: Patient demonstrated improved technique with exercises, stairs following moderate cuing and repeated demonstration and repetition. Patient demonstrated good understanding of home exercises         PT Education - 04-Jul-2017 1039    Education provided Yes   Education Details re assessed exercises   Person(s) Educated Patient   Methods Explanation;Demonstration   Comprehension Verbalized understanding             PT Long Term Goals - 2017-07-04 1200      PT LONG TERM GOAL #1   Title  Patient will demonstrate improved function with stairs, walking on level and uneven surfaces with LEFS score of 55/80 by 07-04-17   Baseline LEFS 41/80: 04-Jul-2017 54/80   Status Achieved     PT LONG TERM GOAL #2   Title Patient will be independent with home program for flexibility, strength by Jul 04, 2017 to be able to transition to self management by discharge from physical therapy   Baseline limited knowledge of appropriate exercises and progression to improve strength, flexiblity without guidance and cuing   Status Achieved               Plan - Jul 04, 2017 1138    Clinical Impression Statement Patient has achieved goals and is ready to continue with independent self managment of sympotms and exercise program. She should continue to improve with self managment.    Rehab Potential Good   Clinical Impairments Affecting Rehab Potential (+)motivated, active PLOF(-)age, comorbidities: arthritis   PT Frequency 2x / week   PT Duration 6 weeks   PT Treatment/Interventions Electrical Stimulation;Cryotherapy;Moist Heat;Patient/family education;Neuromuscular re-education;Therapeutic exercise;Manual techniques   PT Next Visit Plan neurmuscular re education, therapeutic exercise, manual techniques for joint, soft tissue   PT Home Exercise Plan hip adduction with glute sets, hip  abduction sitting with resistive band, diagonal monster walk      Patient will benefit from skilled therapeutic intervention in order to improve the following deficits and impairments:  Decreased strength, Impaired flexibility, Impaired perceived functional ability, Difficulty walking  Visit Diagnosis: Left knee pain, unspecified chronicity  Muscle weakness (generalized)  Difficulty in walking, not elsewhere classified       G-Codes - 07/04/17 Mar 07, 1201    Functional Assessment Tool Used (Outpatient Only) LEFS, strength, pain, clinical judgment   Functional Limitation Mobility: Walking and moving around   Mobility: Walking and Moving Around Goal Status [phone]) At least 1 percent but less than 20 percent impaired, limited or restricted   Mobility: Walking and Moving Around Discharge Status [phone]) At least 1 percent but less than 20 percent impaired, limited or restricted      Problem List Patient Active Problem List   Diagnosis Date Noted  . Total knee replacement status 08/17/2015    Beacher May PT 06/26/2017, 7:56 AM  [place] [facility] PHYSICAL AND SPORTS MEDICINE [address], [place], [address] Phone: [phone]   Fax:  [phone]  Name: Sheri Barker MRN: [id number] Date of Birth: 02-22-1929

## 2017-07-03 ENCOUNTER — Encounter: Payer: Medicare Other | Admitting: Physical Therapy

## 2017-07-29 ENCOUNTER — Other Ambulatory Visit: Payer: Self-pay | Admitting: Family Medicine

## 2017-07-29 DIAGNOSIS — Z1231 Encounter for screening mammogram for malignant neoplasm of breast: Secondary | ICD-10-CM

## 2017-08-08 ENCOUNTER — Ambulatory Visit
Admission: RE | Admit: 2017-08-08 | Discharge: 2017-08-08 | Disposition: A | Discharge status: Home / Self Care | Payer: Medicare Other | Source: Ambulatory Visit | Attending: Family Medicine | Admitting: Family Medicine

## 2017-08-08 DIAGNOSIS — Z1231 Encounter for screening mammogram for malignant neoplasm of breast: Secondary | ICD-10-CM

## 2018-04-22 ENCOUNTER — Other Ambulatory Visit: Payer: Self-pay

## 2018-04-22 ENCOUNTER — Encounter: Payer: Self-pay | Admitting: Physical Therapy

## 2018-04-22 ENCOUNTER — Ambulatory Visit: Payer: Medicare Other | Attending: Family Medicine | Admitting: Physical Therapy

## 2018-04-22 DIAGNOSIS — M6281 Muscle weakness (generalized): Secondary | ICD-10-CM | POA: Insufficient documentation

## 2018-04-22 DIAGNOSIS — M25562 Pain in left knee: Secondary | ICD-10-CM | POA: Diagnosis not present

## 2018-04-22 DIAGNOSIS — R262 Difficulty in walking, not elsewhere classified: Secondary | ICD-10-CM | POA: Insufficient documentation

## 2018-04-23 NOTE — Therapy (Signed)
[facility] [address], [place], [address] Phone: [phone]   Fax:  [phone]  Physical Therapy Evaluation  Patient Details  Name: Sheri Barker MRN: [id number] Date of Birth: 1929-04-28 Referring Provider: Burman Freestone NP   Encounter Date: 04/22/2018  PT End of Session - 04/22/18 1444    Visit Number  1    Number of Visits  12    Date for PT Re-Evaluation  06/03/18    Authorization Type  1/10 progress note    PT Start Time  1352    PT Stop Time  1500    PT Time Calculation (min)  68 min    Activity Tolerance  Patient tolerated treatment well;Patient limited by pain    Behavior During Therapy  [facility] for tasks assessed/performed       Past Medical History:  Diagnosis Date  . Depression   . Elevated lipids   . Hypertension   . Hypothyroidism     Past Surgical History:  Procedure Laterality Date  . ABDOMINAL HYSTERECTOMY    . CATARACT EXTRACTION W/ INTRAOCULAR LENS IMPLANT Bilateral   . KNEE ARTHROPLASTY Right 08/17/2015   Procedure: COMPUTER ASSISTED TOTAL KNEE ARTHROPLASTY;  Surgeon: Donato Heinz, MD;  Location: [facility] ORS;  Service: Orthopedics;  Laterality: Right;  . KNEE ARTHROSCOPY Right   . OSTEOTOMY TOES    . TONSILLECTOMY      There were no vitals filed for this visit.   Subjective Assessment - 04/22/18 1411    Subjective  Patient reports she is having occasional pain in left knee. exacerbation of symptoms 2 months ago and then [facility] day weekend she went up and down a lot of steps. she was seen by family doctor and had and injection about 2 weeks ago     Pertinent History            Patient reports she began to feel pain in left knee, thigh pain following TKA right knee 2016, about a year following her surgery. history of arthritis, hypertension controlled by medication. She has had pain in the past and had PT treatment with good results.  Current symptoms began following  increased activity, going up and down a lot of steps, around 03/29/18.   Limitations  Walking;Standing;Other (comment) flexion and extension of left knee    Patient Stated Goals  decreased left knee pain and be able to walk and be active again    Currently in Pain?  No/denies worst up to 7/10 in lateral aspect of knee and with sleeping         [facility] PT Assessment - 04/22/18 1418      Assessment   Medical Diagnosis  left knee pain    Referring Provider  Meeler, Alphonzo Lemmings NP    Onset Date/Surgical Date  03/29/18    Hand Dominance  Right    Prior Therapy  yes, a year ago       Precautions   Precautions  None      Balance Screen   Has the patient fallen in the past 6 months  No      Home Environment   Living Environment  Private residence    Living Arrangements  Alone    Home Access  Stairs to enter    Entrance Stairs-Number of Steps  3    Entrance Stairs-Rails  Right;Left    Home Layout  Two level patient stays on one level    Home  Equipment  [place] - single point      Prior Function   Level of Independence  Independent    Vocation  Retired;Works at home    [employer]  housework    Leisure  read, family, friends, travel      Cognition   Overall Cognitive Status  Within Functional Limits for tasks assessed      Observation/Other Assessments   Lower Extremity Functional Scale   54/100      Sensation   Light Touch  Appears Intact      ROM / Strength   AROM / PROM / Strength  AROM;Strength      AROM   Overall AROM Comments  right LE WFL throughout; left LE limited knee flexion with pain end range; right hip flexion WFL, ankle WFL       Strength   Overall Strength Comments  right LE grossly 5/5 throughout major muslce groups; left LE unable to assess due to pain with knee movement; grossly at least 4-/5 major muslce groups      Ambulation/Gait   Gait Pattern  Within Functional Limits    Ambulation Surface  Level    Gait velocity  WNL          Objective  measurements completed on examination: See above findings.    Treatment:  Modalities: Electrical stimulation: Russian stim. 10/10 cycle applied (2) electrodes to quadriceps/VMO left LE with patient reclined with LE supported on pillow; pt. Performing quad sets/knee extension with each cycle; goal muscle re education High volt estim.clincial program for muscle spasms  (2) electrodes applied to lateral quadriceps/hamstring left LE intensity to tolerance with patient in reclined position with LE supported on pillow goal: pain, spasms  There ex as outlined in pt. Education  Patient response to treatment: patient demonstrated improved technique with exercises with minimal VC for correct alignment.  Improved motor control following estim.            PT Education - 04/22/18 1600    Education provided  Yes    Education Details  POC; HEP: hip adduction with ball and glute sets, SLR seated, quad sets, HS stretch seated    Person(s) Educated  Patient    Methods  Explanation;Demonstration;Verbal cues;Handout    Comprehension  Verbalized understanding;Returned demonstration;Verbal cues required          PT Long Term Goals - 04/22/18 1726      PT LONG TERM GOAL #1   Title  Patient will demosntrate improved function with left LE for walking and daily activties with FOTO score improved to 60/100    Baseline  FOTO 54/100    Status  New    Target Date  06/04/18      PT LONG TERM GOAL #2   Title  Patient will report mild to no pain with knee flexion/extension activities     Baseline  all movement left knee causes pain    Status  New    Target Date  05/14/18      PT LONG TERM GOAL #3   Title  Patient will be indepenent with home program for flexibility and strength for left LE for self managment of symptoms and return to prior level of function once discharged from physical therapy    Baseline  limited knowledge of appropriate exercises and progression to improve strength, flexibilty  without guidance and cuing    Status  New    Target Date  06/04/18  Plan - 04/22/18 1500    Clinical Impression Statement  Patient is an 84 year old female who presents with exacerbation of left knee pain following increased activity. She has decreased function with walking, climbing stairs, standing and with moving knee. Her FOTO score of  54/100 indicating moderate self perceived disability. She has limited knowledge of appropriate pain control strategies and exercise progression and will require phyisical therapy intervention to improve function.     History and Personal Factors relevant to plan of care:  Patient reports she began to feel pain in left knee, thigh pain following TKA right knee 2016, about a year following her surgery. history of arthritis, hypertension controlled by medication. She has had pain in the past and had PT treatment with good results.  Current symptoms began following increased activity, going up and down a lot of steps, around 03/29/18.   Clinical Presentation  Evolving    Clinical Presentation due to:  exacerbation of left knee symptoms with decrease in functional activitiies    Clinical Decision Making  Moderate    Rehab Potential  Good    Clinical Impairments Affecting Rehab Potential  (+)motivated, active PLOF(-)age, comorbidities: arthritis    PT Frequency  2x / week    PT Duration  6 weeks    PT Treatment/Interventions  Electrical Stimulation;Cryotherapy;Moist Heat;Patient/family education;Neuromuscular re-education;Therapeutic exercise;Manual techniques    PT Next Visit Plan  neurmuscular re education, therapeutic exercise, manual techniques for joint, soft tissue    PT Home Exercise Plan  hip adduction with glute sets, hip abduction sitting with resistive band, SLR seated    Consulted and Agree with Plan of Care  Patient       Patient will benefit from skilled therapeutic intervention in order to improve the following deficits and  impairments:  Pain, Decreased activity tolerance, Decreased endurance, Impaired perceived functional ability, Decreased range of motion, Decreased strength  Visit Diagnosis: Left knee pain, unspecified chronicity - Plan: PT plan of care cert/re-cert  Muscle weakness (generalized) - Plan: PT plan of care cert/re-cert  Difficulty in walking, not elsewhere classified - Plan: PT plan of care cert/re-cert     Problem List Patient Active Problem List   Diagnosis Date Noted  . Total knee replacement status 08/17/2015    Beacher MayBrooks, Gustavia Carie PT 04/23/2018, 6:20 PM  [facility] [address] [place], [place], [address] Phone: [phone]   Fax:  [phone]  Name: Sheri Barker MRN: [id number] Date of Birth: 1929/06/10

## 2018-04-24 ENCOUNTER — Ambulatory Visit: Payer: Medicare Other | Admitting: Physical Therapy

## 2018-04-24 ENCOUNTER — Encounter: Payer: Self-pay | Admitting: Physical Therapy

## 2018-04-24 DIAGNOSIS — M25562 Pain in left knee: Secondary | ICD-10-CM

## 2018-04-24 DIAGNOSIS — M6281 Muscle weakness (generalized): Secondary | ICD-10-CM

## 2018-04-24 DIAGNOSIS — R262 Difficulty in walking, not elsewhere classified: Secondary | ICD-10-CM

## 2018-04-24 NOTE — Therapy (Signed)
[facility] [address] [place], [place], [address] Phone: [phone]   Fax:  [phone]  Physical Therapy Treatment  Patient Details  Name: Sheri Barker MRN: [id number] Date of Birth: [birth year] Referring Provider: Burman FreestoneMeeler, Whitney NP   Encounter Date: 04/24/2018  PT End of Session - 04/24/18 1126    Visit Number  2    Number of Visits  12    Date for PT Re-Evaluation  06/03/18    Authorization Type  2/10 progress note    PT Start Time  1030    PT Stop Time  1118    PT Time Calculation (min)  48 min    Activity Tolerance  Patient tolerated treatment well;Patient limited by pain    Behavior During Therapy  [facility] assessed/performed       Past Medical History:  Diagnosis Date  . Depression   . Elevated lipids   . Hypertension   . Hypothyroidism     Past Surgical History:  Procedure Laterality Date  . ABDOMINAL HYSTERECTOMY    . CATARACT EXTRACTION W/ INTRAOCULAR LENS IMPLANT Bilateral   . KNEE ARTHROPLASTY Right 08/17/2015   Procedure: COMPUTER ASSISTED TOTAL KNEE ARTHROPLASTY;  Surgeon: Donato HeinzJames P Hooten, MD;  Location: [facility] ORS;  Service: Orthopedics;  Laterality: Right;  . KNEE ARTHROSCOPY Right   . OSTEOTOMY TOES    . TONSILLECTOMY      There were no vitals filed for this visit.  Subjective Assessment - 04/24/18 1030    Subjective  Patient reports she felt a flare up of her symtpoms in left LE the other day with walking and took care of it with moist heat.     Pertinent History  Patient reports she began to feel pain in left knee, thigh pain following TKA right knee 2016, about a year following her surgery. history of arthritis, hypertension controlled by medication    Limitations  Walking;Standing;Other (comment) flexion and extension of left knee    Patient Stated Goals  decreased left knee pain and be able to walk and be active again    Currently in Pain?  No/denies        Objective: Palpation: left knee/LE point tender along lateral aspect of knee and thigh, quadriceps, hamstring Gait: ambulating independently with mild antalgic pattern  Treatment:  Modalities: US 1MHz pulsed 50% @ 1.3 w/cm2 x 10 min to lateral aspect of left knee  Electrical stimulation: 15 min.  Russian stim. 10/10 cycle applied (2) electrodes to quadriceps/VMO left LE with patient reclined with LE supported on pillow; pt. Performing quad sets with each cycle; goal muscle re education High volt estim.clincial program for muscle spasms  (2) electrodes applied to lateral quadriceps/hamstring left LE intensity to tolerance with patient in reclined position with LE supported on pillow goal: pain, spasms   Therapeutic exercise: patient performed with instruction, demonstration, verbal cues of therapist: goal: independent with home program, strength, endurance  sitting:  hip adduction with ball and glute sets x 10 SLR seated with demonstration, VC x 10 hamstring stretch  x 20 seconds with demonstration and VC instructed with handout: aquatic exercises: walking forward, backwards and side stepping x 2 min., hip abduction, extension, hamstring curls, calf raises: patient to perform in pool while on vacation next week   Patient response to treatment: patient demonstrated improved technique with exercises with minimal VC for correct alignment.  Improved motor control following estim. and no pain reported at end of session  and able to walk without discomfort in left knee    PT Education - 04/24/18 1100    Education provided  Yes    Education Details  exercise instruction and re assessed HEP    Person(s) Educated  Patient    Methods  Explanation;Demonstration;Verbal cues    Comprehension  Verbalized understanding;Returned demonstration;Verbal cues required          PT Long Term Goals - 04/22/18 1726      PT LONG TERM GOAL #1   Title  Patient will demosntrate improved function with  left LE for walking and daily activties with FOTO score improved to 60/100    Baseline  FOTO 54/100    Status  New    Target Date  06/04/18      PT LONG TERM GOAL #2   Title  Patient will report mild to no pain with knee flexion/extension activities     Baseline  all movement left knee causes pain    Status  New    Target Date  05/14/18      PT LONG TERM GOAL #3   Title  Patient will be indepenent with home program for flexibility and strength for left LE for self managment of symptoms and return to prior level of function once discharged from physical therapy    Baseline  limited knowledge of appropriate exercises and progression to improve strength, flexibilty without guidance and cuing    Status  New    Target Date  06/04/18            Plan - 04/24/18 1120    Clinical Impression Statement  Patient demonstrated decreased pain in knee with standing and walking following treatment today. She is progressing with exercises and will require additional physical therapy intervention to achieve goals.     Rehab Potential  Good    Clinical Impairments Affecting Rehab Potential  (+)motivated, active PLOF(-)age, comorbidities: arthritis    PT Frequency  2x / week    PT Duration  6 weeks    PT Treatment/Interventions  Electrical Stimulation;Cryotherapy;Moist Heat;Patient/family education;Neuromuscular re-education;Therapeutic exercise;Manual techniques    PT Next Visit Plan  neurmuscular re education, therapeutic exercise, manual techniques for joint, soft tissue    PT Home Exercise Plan  hip adduction with glute sets, hip abduction sitting with resistive band, SLR seated    Consulted and Agree with Plan of Care  Patient       Patient will benefit from skilled therapeutic intervention in order to improve the following deficits and impairments:  Pain, Decreased activity tolerance, Decreased endurance, Impaired perceived functional ability, Decreased range of motion, Decreased  strength  Visit Diagnosis: Left knee pain, unspecified chronicity  Muscle weakness (generalized)  Difficulty in walking, not elsewhere classified     Problem List Patient Active Problem List   Diagnosis Date Noted  . Total knee replacement status 08/17/2015    Beacher May PT 04/24/2018, 8:21 PM  [place] [facility] [address], [place], [address] Phone: [phone]   Fax:  [phone]  Name: Sheri Barker MRN: [id number] Date of Birth: 1928-11-28

## 2018-05-06 ENCOUNTER — Ambulatory Visit: Payer: Medicare Other | Attending: Family Medicine | Admitting: Physical Therapy

## 2018-05-06 ENCOUNTER — Encounter: Payer: Self-pay | Admitting: Physical Therapy

## 2018-05-06 DIAGNOSIS — M6281 Muscle weakness (generalized): Secondary | ICD-10-CM | POA: Diagnosis present

## 2018-05-06 DIAGNOSIS — M25562 Pain in left knee: Secondary | ICD-10-CM

## 2018-05-06 DIAGNOSIS — R262 Difficulty in walking, not elsewhere classified: Secondary | ICD-10-CM

## 2018-05-06 NOTE — Therapy (Signed)
[facility] [facility] [address] [place], [place], [address] Phone: [phone]   Fax:  [phone]  Physical Therapy Treatment  Patient Details  Name: Sheri IronMary W Krawczyk MRN: [id number] Date of Birth: August 03, 1929 Referring Provider: Burman FreestoneMeeler, Whitney NP   Encounter Date: 05/06/2018  PT End of Session - 05/06/18 1442    Visit Number  3    Number of Visits  12    Date for PT Re-Evaluation  06/03/18    Authorization Type  3/10 progress note    PT Start Time  1348    PT Stop Time  1438    PT Time Calculation (min)  50 min    Activity Tolerance  Patient tolerated treatment well;Patient limited by pain    Behavior During Therapy  [facility] for [facility] assessed/performed       Past Medical History:  Diagnosis Date  . Depression   . Elevated lipids   . Hypertension   . Hypothyroidism     Past Surgical History:  Procedure Laterality Date  . ABDOMINAL HYSTERECTOMY    . CATARACT EXTRACTION W/ INTRAOCULAR LENS IMPLANT Bilateral   . KNEE ARTHROPLASTY Right 08/17/2015   Procedure: COMPUTER ASSISTED TOTAL KNEE ARTHROPLASTY;  Surgeon: Donato HeinzJames P Hooten, MD;  Location: [facility] ORS;  Service: Orthopedics;  Laterality: Right;  . KNEE ARTHROSCOPY Right   . OSTEOTOMY TOES    . TONSILLECTOMY      There were no vitals filed for this visit.  Subjective Assessment - 05/06/18 1352    Subjective  Patient reports she continues with pain in posterior left knee and is worse at night with turning over to either side    Pertinent History  Patient reports she began to feel pain in left knee, thigh pain following TKA right knee 2016, about a year following her surgery. history of arthritis, hypertension controlled by medication    Limitations  Walking;Standing;Other (comment) flexion and extension of left knee    Patient Stated Goals  decreased left knee pain and be able to walk and be active again    Currently in Pain?  No/denies increases with sit to stand from  average chair or with turning over in bed          Objective: Palpation: left knee/LE point tender along lateral aspect of knee and thigh, quadriceps, hamstring Gait: ambulating independently with mild antalgic pattern   Treatment:  Modalities: US 1MHz pulsed 50% @ 1.3 w/cm2 x 10 min to lateral and posterior aspect of left knee/calf muscle with patient prone   Electrical stimulation: 15 min.  Russian stim. 10/10 cycle applied (2) electrodes to quadriceps/VMO left LE with patient reclined with LE supported on pillow; pt. Performing quad sets with each cycle; goal muscle re education High volt estim.clincial program for muscle spasms  (2) electrodes applied to lateral calf /hamstring left LE intensity to tolerance with patient in reclined position with LE supported on pillow goal: pain, spasms   Therapeutic exercise: patient performed with instruction, demonstration, verbal cues of therapist: goal: independent with home program, strength, endurance   supine: hip and knee flexion with ball and assistance 2 x 10 hip adduction with ball and glute sets x 10 gentle hamstring and calf stretches with assistance 3 x 20 seconds   Patient response to treatment: patient reported decreased pain to mild and much better on standing and walking. Decreased spasm and tenderness >50% following US. Improved technique with exercises with minimal cuing and instruction, demonstration.  PT Education - 05/06/18 1400    Education provided  Yes    Education Details  exercise instruction     Person(s) Educated  Patient    Methods  Explanation;Demonstration;Verbal cues;Handout    Comprehension  Verbal cues required;Returned demonstration;Verbalized understanding          PT Long Term Goals - 04/22/18 1726      PT LONG TERM GOAL #1   Title  Patient will demosntrate improved function with left LE for walking and daily activties with FOTO score improved to 60/100    Baseline  FOTO 54/100     Status  New    Target Date  06/04/18      PT LONG TERM GOAL #2   Title  Patient will report mild to no pain with knee flexion/extension activities     Baseline  all movement left knee causes pain    Status  New    Target Date  05/14/18      PT LONG TERM GOAL #3   Title  Patient will be indepenent with home program for flexibility and strength for left LE for self managment of symptoms and return to prior level of function once discharged from physical therapy    Baseline  limited knowledge of appropriate exercises and progression to improve strength, flexibilty without guidance and cuing    Status  New    Target Date  06/04/18            Plan - 05/06/18 1439    Clinical Impression Statement  Patient improved gait pattern with decreased pain in left knee and LE. She continues with spasms and pain in left LE with difficulty and pain with rolling over in bed and sit to stand and will benefint from continued physical therapy intervention to achieve goals.     Rehab Potential  Good    Clinical Impairments Affecting Rehab Potential  (+)motivated, active PLOF(-)age, comorbidities: arthritis    PT Frequency  2x / week    PT Duration  6 weeks    PT Treatment/Interventions  Electrical Stimulation;Cryotherapy;Moist Heat;Patient/family education;Neuromuscular re-education;Therapeutic exercise;Manual techniques    PT Next Visit Plan  neurmuscular re education, therapeutic exercise, manual techniques for joint, soft tissue    PT Home Exercise Plan  hip adduction with glute sets, hip abduction sitting with resistive band, SLR seated       Patient will benefit from skilled therapeutic intervention in order to improve the following deficits and impairments:  Pain, Decreased activity tolerance, Decreased endurance, Impaired perceived functional ability, Decreased range of motion, Decreased strength  Visit Diagnosis: Left knee pain, unspecified chronicity  Muscle weakness  (generalized)  Difficulty in walking, not elsewhere classified     Problem List Patient Active Problem List   Diagnosis Date Noted  . Total knee replacement status 08/17/2015    Beacher May PT 05/07/2018, 9:41 AM  [place] [facility] [address], [place], [address] Phone: [phone]   Fax:  [phone]  Name: CEDAR ROSEMAN MRN: [id number] Date of Birth: 19-Oct-1929

## 2018-05-13 ENCOUNTER — Ambulatory Visit: Payer: Medicare Other | Admitting: Physical Therapy

## 2018-05-13 ENCOUNTER — Encounter: Payer: Self-pay | Admitting: Physical Therapy

## 2018-05-13 DIAGNOSIS — M6281 Muscle weakness (generalized): Secondary | ICD-10-CM

## 2018-05-13 DIAGNOSIS — R262 Difficulty in walking, not elsewhere classified: Secondary | ICD-10-CM

## 2018-05-13 DIAGNOSIS — M25562 Pain in left knee: Secondary | ICD-10-CM

## 2018-05-14 NOTE — Therapy (Signed)
[place] [facility]INE [address], [place], [address] Phone: [phone]   Fax:  [phone]  Physical Therapy Treatment  Patient Details  Name: Sheri Barker MRN: [id number] Date of Birth: [birth year] Referring Provider: Burman Freestone NP   Encounter Date: 05/13/2018  PT End of Session - 05/13/18 1040    Visit Number  4    Number of Visits  12    Date for PT Re-Evaluation  06/03/18    Authorization Type  4/10 progress note    PT Start Time  1033    PT Stop Time  1115    PT Time Calculation (min)  42 min    Activity Tolerance  Patient tolerated treatment well;Patient limited by pain    Behavior During Therapy  [facility] for tasks assessed/performed       Past Medical History:  Diagnosis Date  . Depression   . Elevated lipids   . Hypertension   . Hypothyroidism     Past Surgical History:  Procedure Laterality Date  . ABDOMINAL HYSTERECTOMY    . CATARACT EXTRACTION W/ INTRAOCULAR LENS IMPLANT Bilateral   . KNEE ARTHROPLASTY Right 08/17/2015   Procedure: COMPUTER ASSISTED TOTAL KNEE ARTHROPLASTY;  Surgeon: Donato Heinz, MD;  Location: [facility] ORS;  Service: Orthopedics;  Laterality: Right;  . KNEE ARTHROSCOPY Right   . OSTEOTOMY TOES    . TONSILLECTOMY      There were no vitals filed for this visit.  Subjective Assessment - 05/13/18 1034    Subjective  Patient reports pain in back of left knee with bending knee and with standing; pain improves once up and moving     Pertinent History  Patient reports she began to feel pain in left knee, thigh pain following TKA right knee 2016, about a year following her surgery. history of arthritis, hypertension controlled by medication    Limitations  Walking;Standing;Other (comment) flexion and extension of left knee    Patient Stated Goals  decreased left knee pain and be able to walk and be active again    Currently in Pain?  No/denies increases with sit to stand from  average height chair and rolling over in bed         Objective: Palpation: left knee/LE point tender posterior aspect medial and lateral aspect Gait: ambulating independently with mild antalgic pattern   Treatment:  Modalities: [place] pulsed 50% @ 1.3 w/cm2 x 10 min to posterior aspect of left knee/calf muscle with patient prone lying with pillow supporting LE's   Electrical stimulation: 15 min.  Russian stim. 10/10 cycle applied (2) electrodes to quadriceps/VMO left LE with patient reclined with LE supported on pillow; pt. Performing quad sets with each cycle; goal muscle re education High volt estim.clincial program for muscle spasms  (2) electrodes applied to posterior aspect of knee left LE, intensity to tolerance with patient in reclined position with LE supported on pillow goal: pain, spasms moist heat applied to left knee in conjunction with estim: no adverse effects noted: goal: pain   Therapeutic exercise: patient performed with instruction, demonstration, verbal cues of therapist: goal: independent with home program, strength, endurance   sitting: SLR x 5 knee flexion with resistive band demonstrated by therapist with patient verbalizing understanding re assessed HEP: hip adduction with ball and glute sets, clam exercise instructed patient in sit to stand with decreasing weight on left LE by placing foot more forward and instructed patient to not cross left LE over  right in sitting to decreased strain on knee   Patient response to treatment: Patient demonstrated good understanding of HEP and required minimal cuing and demonstration to perform with good technique. patient reported decreased pain to mild, none on standing and walking at end of session.             PT Education - 05/13/18 1039    Education provided  Yes    Education Details  exercise instruction     Person(s) Educated  Patient    Methods  Explanation;Demonstration;Verbal cues    Comprehension   Verbalized understanding;Returned demonstration;Verbal cues required          PT Long Term Goals - 04/22/18 1726      PT LONG TERM GOAL #1   Title  Patient will demosntrate improved function with left LE for walking and daily activties with FOTO score improved to 60/100    Baseline  FOTO 54/100    Status  New    Target Date  06/04/18      PT LONG TERM GOAL #2   Title  Patient will report mild to no pain with knee flexion/extension activities     Baseline  all movement left knee causes pain    Status  New    Target Date  05/14/18      PT LONG TERM GOAL #3   Title  Patient will be indepenent with home program for flexibility and strength for left LE for self managment of symptoms and return to prior level of function once discharged from physical therapy    Baseline  limited knowledge of appropriate exercises and progression to improve strength, flexibilty without guidance and cuing    Status  New    Target Date  06/04/18            Plan - 05/13/18 1115    Clinical Impression Statement  Patient demonstrated decreased pain to mild and able to perform sit to stand and walk with no reports of pain at end of session. She is responding well to current treatment and is progressing well with exercises. she will continue to benefit from additional physical therapy intervention to achieve goals.     Rehab Potential  Good    Clinical Impairments Affecting Rehab Potential  (+)motivated, active PLOF(-)age, comorbidities: arthritis    PT Frequency  2x / week    PT Duration  6 weeks    PT Treatment/Interventions  Electrical Stimulation;Cryotherapy;Moist Heat;Patient/family education;Neuromuscular re-education;Therapeutic exercise;Manual techniques    PT Next Visit Plan  neurmuscular re education, therapeutic exercise, manual techniques for joint, soft tissue    PT Home Exercise Plan  hip adduction with glute sets, hip abduction sitting with resistive band, SLR seated       Patient will  benefit from skilled therapeutic intervention in order to improve the following deficits and impairments:  Pain, Decreased activity tolerance, Decreased endurance, Impaired perceived functional ability, Decreased range of motion, Decreased strength  Visit Diagnosis: Left knee pain, unspecified chronicity  Muscle weakness (generalized)  Difficulty in walking, not elsewhere classified     Problem List Patient Active Problem List   Diagnosis Date Noted  . Total knee replacement status 08/17/2015    Beacher MayBrooks, Akiem Urieta PT 05/14/2018, 6:52 AM  [place] [facility] [address] [place], [place], [address] Phone: [phone]   Fax:  [phone]  Name: Sheri Barker MRN: [id number] Date of Birth: June 24, 1929

## 2018-05-15 ENCOUNTER — Encounter: Payer: Self-pay | Admitting: Physical Therapy

## 2018-05-15 ENCOUNTER — Ambulatory Visit: Payer: Medicare Other | Admitting: Physical Therapy

## 2018-05-15 DIAGNOSIS — M25562 Pain in left knee: Secondary | ICD-10-CM

## 2018-05-15 DIAGNOSIS — R262 Difficulty in walking, not elsewhere classified: Secondary | ICD-10-CM

## 2018-05-15 DIAGNOSIS — M6281 Muscle weakness (generalized): Secondary | ICD-10-CM

## 2018-05-15 NOTE — Therapy (Signed)
[place] [facility] [address] [place], [address] Phone: [phone]   Fax:  [phone]  Physical Therapy Treatment  Patient Details  Name: Sheri Barker MRN: [id number] Date of Birth: 1929-10-03 Referring Provider: Burman Freestone NP   Encounter Date: 05/15/2018  PT End of Session - 05/15/18 1345    Visit Number  5    Number of Visits  12    Date for PT Re-Evaluation  06/03/18    Authorization Type  5/10 progress note    PT Start Time  1327    PT Stop Time  1415    PT Time Calculation (min)  48 min    Activity Tolerance  Patient tolerated treatment well;Patient limited by pain    Behavior During Therapy  [facility] for tasks assessed/performed       Past Medical History:  Diagnosis Date  . Depression   . Elevated lipids   . Hypertension   . Hypothyroidism     Past Surgical History:  Procedure Laterality Date  . ABDOMINAL HYSTERECTOMY    . CATARACT EXTRACTION W/ INTRAOCULAR LENS IMPLANT Bilateral   . KNEE ARTHROPLASTY Right 08/17/2015   Procedure: COMPUTER ASSISTED TOTAL KNEE ARTHROPLASTY;  Surgeon: Donato Heinz, MD;  Location: [facility] ORS;  Service: Orthopedics;  Laterality: Right;  . KNEE ARTHROSCOPY Right   . OSTEOTOMY TOES    . TONSILLECTOMY      There were no vitals filed for this visit.  Subjective Assessment - 05/15/18 1328    Subjective  Less pain in left knee following previous session with good carry over and working on not crossing legs.     Pertinent History  Patient reports she began to feel pain in left knee, thigh pain following TKA right knee 2016, about a year following her surgery. history of arthritis, hypertension controlled by medication    Limitations  Walking;Standing;Other (comment) flexion and extension of left knee    Patient Stated Goals  decreased left knee pain and be able to walk and be active again    Currently in Pain?  No/denies       Objective: Palpation: left knee/LE  point tender posterior aspect medial and lateral aspect   Treatment:  Modalities: [place] pulsed 50% @ 1.3 w/cm2 x 10 min to posterior aspect of left knee/calf muscle with patient prone lying with pillow supporting LE's   Electrical stimulation: 15 min.  Russian stim. 10/10 cycle applied (2) electrodes to quadriceps/VMO left LE with patient reclined with LE supported on pillow; pt. Performing quad sets with each cycle; goal muscle re education High volt estim.clincial program for muscle spasms  (2) electrodes applied to posterior aspect of knee left LE, intensity to tolerance with patient in reclined position with LE supported on pillow goal: pain, spasms moist heat applied to left knee in conjunction with estim: no adverse effects noted: goal: pain   Therapeutic exercise: patient performed with instruction, demonstration, verbal cues of therapist: goal: independent with home program, strength, endurance   sitting: ball between knees sit to stand x 5 reps knee extension x 10 reps left LE knee flexion against red resistive band x 15 reps  Patient response to treatment: Patient demonstrated improved technique with exercise and required minimal cuing and demonstration to perform with good technique. patient reported decreased pain to mild, none on standing and walking at end of session.      PT Education - 05/15/18 1330    Education provided  Yes    Education Details  exercise instruction    Person(s) Educated  Patient    Methods  Explanation;Demonstration;Verbal cues    Comprehension  Verbalized understanding;Returned demonstration;Verbal cues required          PT Long Term Goals - 04/22/18 1726      PT LONG TERM GOAL #1   Title  Patient will demosntrate improved function with left LE for walking and daily activties with FOTO score improved to 60/100    Baseline  FOTO 54/100    Status  New    Target Date  06/04/18      PT LONG TERM GOAL #2   Title  Patient will report mild to  no pain with knee flexion/extension activities     Baseline  all movement left knee causes pain    Status  New    Target Date  05/14/18      PT LONG TERM GOAL #3   Title  Patient will be indepenent with home program for flexibility and strength for left LE for self managment of symptoms and return to prior level of function once discharged from physical therapy    Baseline  limited knowledge of appropriate exercises and progression to improve strength, flexibilty without guidance and cuing    Status  New    Target Date  06/04/18            Plan - 05/15/18 1415    Clinical Impression Statement  Patient demosntrates good carry over between sessions with decreasing pain left knee and improving ability to walk and perform daily chores with less difficulty . She continues with limitations of pain and decreased strength and will benefit from additional physical therapy intervention to achieve goals.     Rehab Potential  Good    Clinical Impairments Affecting Rehab Potential  (+)motivated, active PLOF(-)age, comorbidities: arthritis    PT Frequency  2x / week    PT Duration  6 weeks    PT Treatment/Interventions  Electrical Stimulation;Cryotherapy;Moist Heat;Patient/family education;Neuromuscular re-education;Therapeutic exercise;Manual techniques    PT Next Visit Plan  neurmuscular re education, therapeutic exercise, manual techniques for joint, soft tissue    PT Home Exercise Plan  hip adduction with glute sets, hip abduction sitting with resistive band, SLR seated       Patient will benefit from skilled therapeutic intervention in order to improve the following deficits and impairments:  Pain, Decreased activity tolerance, Decreased endurance, Impaired perceived functional ability, Decreased range of motion, Decreased strength  Visit Diagnosis: Left knee pain, unspecified chronicity  Muscle weakness (generalized)  Difficulty in walking, not elsewhere classified     Problem  List Patient Active Problem List   Diagnosis Date Noted  . Total knee replacement status 08/17/2015    Beacher MayBrooks, Hollie Wojahn PT 05/15/2018, 11:48 PM  [place] [facility] [address] [place], [place], [address] Phone: [phone]   Fax:  [phone]  Name: Luiz IronMary W Mcglasson MRN: [id number] Date of Birth: Nov 24, 1928

## 2018-05-20 ENCOUNTER — Ambulatory Visit: Payer: Medicare Other | Admitting: Physical Therapy

## 2018-05-20 ENCOUNTER — Encounter: Payer: Self-pay | Admitting: Physical Therapy

## 2018-05-20 DIAGNOSIS — M6281 Muscle weakness (generalized): Secondary | ICD-10-CM

## 2018-05-20 DIAGNOSIS — R262 Difficulty in walking, not elsewhere classified: Secondary | ICD-10-CM

## 2018-05-20 DIAGNOSIS — M25562 Pain in left knee: Secondary | ICD-10-CM | POA: Diagnosis not present

## 2018-05-20 NOTE — Therapy (Signed)
[place] [facility]ORTS MEDICINE [address] [place], [address] Phone: [phone]   Fax:  [phone]  Physical Therapy Treatment  Patient Details  Name: Sheri Barker MRN: [id number] Date of Birth: [birth year] Referring Provider: Burman Freestone NP   Encounter Date: 05/20/2018  PT End of Session - 05/20/18 1306    Visit Number  6    Number of Visits  12    Date for PT Re-Evaluation  06/03/18    Authorization Type  6/10 progress note    Authorization Time Period  6/6 FOTO    PT Start Time  1301    PT Stop Time  1353    PT Time Calculation (min)  52 min    Activity Tolerance  Patient tolerated treatment well;Patient limited by pain    Behavior During Therapy  [facility] for tasks assessed/performed       Past Medical History:  Diagnosis Date  . Depression   . Elevated lipids   . Hypertension   . Hypothyroidism     Past Surgical History:  Procedure Laterality Date  . ABDOMINAL HYSTERECTOMY    . CATARACT EXTRACTION W/ INTRAOCULAR LENS IMPLANT Bilateral   . KNEE ARTHROPLASTY Right 08/17/2015   Procedure: COMPUTER ASSISTED TOTAL KNEE ARTHROPLASTY;  Surgeon: Donato Heinz, MD;  Location: [facility] ORS;  Service: Orthopedics;  Laterality: Right;  . KNEE ARTHROSCOPY Right   . OSTEOTOMY TOES    . TONSILLECTOMY      There were no vitals filed for this visit.  Subjective Assessment - 05/20/18 1303    Subjective  Patient reports she did well following last session and today her left knee is hurting around lateral joint line, which began this morning. She reports she is sleeping better and is able to turn knee with less intensity of symptoms.     Pertinent History  Patient reports she began to feel pain in left knee, thigh pain following TKA right knee 2016, about a year following her surgery. history of arthritis, hypertension controlled by medication    Limitations  Walking;Standing;Other (comment) flexion and extension of left knee     Patient Stated Goals  decreased left knee pain and be able to walk and be active again    Currently in Pain?  Yes    Pain Score  5     Pain Location  Knee    Pain Orientation  Left    Pain Descriptors / Indicators  Aching;Sore    Pain Type  Chronic pain;Acute pain    Pain Onset  Today    Pain Frequency  Intermittent         Objective: Palpation: left knee/LE point tender posterior aspect medial and lateral aspect Observation: mild swelling noted left knee as compared to right Outcome measure: FOTO 53/100 (initially 54/100)   Treatment:  Modalities: [place] pulsed 50% @ 1.3 w/cm2 x 10 min to lateral aspect of left knee with patient supine lying with pillow supporting LE's   Electrical stimulation: 15 min.  Russian stim. 10/10 cycle with intensity to contraction/tolerance applied (2) electrodes to quadriceps/VMO left LE with patient reclined with LE supported on pillow; pt. Performing quad sets with each cycle; goal muscle re education High volt estim.clincial program for muscle spasms  (2) electrodes applied to posterior and lateral aspect of knee left LE, intensity to tolerance with patient in reclined position with LE supported on pillow goal: pain, spasms moist heat applied to left knee in conjunction with  estim: no adverse effects noted: goal: pain   Therapeutic exercise: patient performed with instruction, demonstration, verbal cues of therapist: goal: independent with home program, strength, endurance   sitting: ball between knees for all exercises: sit to stand x 5 reps with reports of discomfort left knee lateral aspect, tried with cushioned airex pad with same pain/discomfort   knee extension x 10 reps left LE knee flexion against red resistive band without ball between knees x 15 reps; improved control of left LE as compared to previous session   Patient response to treatment: Patient demonstrated improved technique with exercise with minimal cuing and demonstration for  good technique. patient reported decreased pain to mild 2/10 on standing and walking at end of session.            PT Education - 05/20/18 1305    Education provided  Yes    Education Details  exercise instruction    Person(s) Educated  Patient    Methods  Explanation;Demonstration;Verbal cues    Comprehension  Verbalized understanding;Returned demonstration;Verbal cues required          PT Long Term Goals - 05/20/18 1355      PT LONG TERM GOAL #1   Title  Patient will demosntrate improved function with left LE for walking and daily activties with FOTO score improved to 60/100    Baseline  FOTO 54/100; FOTO 05/20/18 53/100    Status  On-going    Target Date  06/04/18      PT LONG TERM GOAL #2   Title  Patient will report mild to no pain with knee flexion/extension activities     Baseline  all movement left knee causes pain    Status  Achieved      PT LONG TERM GOAL #3   Title  Patient will be indepenent with home program for flexibility and strength for left LE for self managment of symptoms and return to prior level of function once discharged from physical therapy    Baseline  limited knowledge of appropriate exercises and progression to improve strength, flexibilty without guidance and cuing    Status  On-going    Target Date  06/04/18            Plan - 05/20/18 1353    Clinical Impression Statement  Patient continues to benefit from physical therapy intervention for pain control and progressive exercise with guidance. She continues with FOTO score of 53 due to chronic condition as she is waiting for consult for TKA. She demonstrated improved control with left knee flexion against resistive band today as compared to previous session.     Rehab Potential  Good    Clinical Impairments Affecting Rehab Potential  (+)motivated, active PLOF(-)age, comorbidities: arthritis    PT Frequency  2x / week    PT Duration  6 weeks    PT Treatment/Interventions  Electrical  Stimulation;Cryotherapy;Moist Heat;Patient/family education;Neuromuscular re-education;Therapeutic exercise;Manual techniques    PT Next Visit Plan  neurmuscular re education, therapeutic exercise, manual techniques for joint, soft tissue    PT Home Exercise Plan  hip adduction with glute sets, hip abduction sitting with resistive band, SLR seated       Patient will benefit from skilled therapeutic intervention in order to improve the following deficits and impairments:  Pain, Decreased activity tolerance, Decreased endurance, Impaired perceived functional ability, Decreased range of motion, Decreased strength  Visit Diagnosis: Left knee pain, unspecified chronicity  Muscle weakness (generalized)  Difficulty in walking, not elsewhere classified  Problem List Patient Active Problem List   Diagnosis Date Noted  . Total knee replacement status 08/17/2015    Beacher May PT 05/21/2018, 11:49 AM  [place] [facility] PHYSICAL AND SPORTS MEDICINE [address] [place], [address] Phone: [phone]   Fax:  [phone]  Name: Sheri Barker MRN: [id number] Date of Birth: 04-Feb-1929

## 2018-05-22 ENCOUNTER — Encounter: Payer: Self-pay | Admitting: Physical Therapy

## 2018-05-22 ENCOUNTER — Ambulatory Visit: Payer: Medicare Other | Admitting: Physical Therapy

## 2018-05-22 DIAGNOSIS — M25562 Pain in left knee: Secondary | ICD-10-CM | POA: Diagnosis not present

## 2018-05-22 DIAGNOSIS — M6281 Muscle weakness (generalized): Secondary | ICD-10-CM

## 2018-05-22 DIAGNOSIS — R262 Difficulty in walking, not elsewhere classified: Secondary | ICD-10-CM

## 2018-05-23 NOTE — Therapy (Signed)
[place] [facility] PHYS[facility] AND SPORTS MEDICINE [address] [place], [place], [address] Phone: [phone]   Fax:  [phone]  Physical Therapy Treatment  Patient Details  Name: Sheri IronMary W Winkowski MRN: [id number] Date of Birth: [birth year] Referring Provider: Burman FreestoneMeeler, Whitney NP   Encounter Date: 05/22/2018  PT End of Session - 05/22/18 1047    Visit Number  7    Number of Visits  12    Date for PT Re-Evaluation  06/03/18    Authorization Type  7/10 progress note    Authorization Time Period  1/6 FOTO    PT Start Time  1045    PT Stop Time  1120    PT Time Calculation (min)  35 min    Activity Tolerance  Patient tolerated treatment well;Patient limited by pain    Behavior During Therapy  [facility] for tasks assessed/performed       Past Medical History:  Diagnosis Date  . Depression   . Elevated lipids   . Hypertension   . Hypothyroidism     Past Surgical History:  Procedure Laterality Date  . ABDOMINAL HYSTERECTOMY    . CATARACT EXTRACTION W/ INTRAOCULAR LENS IMPLANT Bilateral   . KNEE ARTHROPLASTY Right 08/17/2015   Procedure: COMPUTER ASSISTED TOTAL KNEE ARTHROPLASTY;  Surgeon: Donato HeinzJames P Hooten, MD;  Location: [facility] ORS;  Service: Orthopedics;  Laterality: Right;  . KNEE ARTHROSCOPY Right   . OSTEOTOMY TOES    . TONSILLECTOMY      There were no vitals filed for this visit.  Subjective Assessment - 05/22/18 1055    Subjective  Patient reports she is doing well and does not have any pain today in left knee. She still has pain in left knee with turning in bed.    Pertinent History  Patient reports she began to feel pain in left knee, thigh pain following TKA right knee 2016, about a year following her surgery. history of arthritis, hypertension controlled by medication    Limitations  Walking;Standing;Other (comment) flexion and extension of left knee    Patient Stated Goals  decreased left knee pain and be able to walk and be active again    Currently in Pain?  No/denies      Objective: Quadriceps contraction improved from previous session Palpation: mild tenderness lateral aspect left knee   Treatment:  Modalities: US 1MHz pulsed 50% @ 1.3 w/cm2 x 10 min to lateral aspect of left knee with patient supine lying with pillow supporting LE's   Electrical stimulation: 15 min.  Russian stim. 10/10 cycle with intensity to contraction/tolerance applied (2) electrodes to quadriceps/VMO left LE with patient reclined with LE supported on pillow; pt. Performing quad sets with each cycle; goal muscle re education High volt estim.clincial program for muscle spasms  (2) electrodes applied to posterior and lateral aspect of knee left LE, intensity to tolerance with patient in reclined position with LE supported on pillow goal: pain, spasms moist heat applied to left knee in conjunction with estim: no adverse effects noted: goal: pain   Therapeutic exercise: patient performed with instruction, demonstration, verbal cues of therapist: goal: independent with home program, strength, endurance   sitting:  sit to stand with ball between knees  x 5 reps with reports of no discomfort left knee  knee flexion reviewed against red resistive band without ball between knees x 15 reps; improved control of left LE    Patient response to treatment: Patient demonstrated improved technique with exercise with minimal cuing and  demonstration for good technique.          PT Education - 05/22/18 1110    Education provided  Yes    Education Details  exercise instruction and re assessment of home exercises    Person(s) Educated  Patient    Methods  Explanation;Demonstration;Verbal cues    Comprehension  Verbalized understanding;Returned demonstration;Verbal cues required          PT Long Term Goals - 05/20/18 1355      PT LONG TERM GOAL #1   Title  Patient will demosntrate improved function with left LE for walking and daily activties with FOTO  score improved to 60/100    Baseline  FOTO 54/100; FOTO 05/20/18 53/100    Status  On-going    Target Date  06/04/18      PT LONG TERM GOAL #2   Title  Patient will report mild to no pain with knee flexion/extension activities     Baseline  all movement left knee causes pain    Status  Achieved      PT LONG TERM GOAL #3   Title  Patient will be indepenent with home program for flexibility and strength for left LE for self managment of symptoms and return to prior level of function once discharged from physical therapy    Baseline  limited knowledge of appropriate exercises and progression to improve strength, flexibilty without guidance and cuing    Status  On-going    Target Date  06/04/18            Plan - 05/22/18 1020    Clinical Impression Statement  Patient is progressing steadily with goals with decreasing pain in left knee and improved function with ambulation, daily activities at home. She demonstrated improved motor control, contraction of quadriceps with treatment today. She will benefit from continued phyiscal therapy intervention to further strengthen and control pain to achieve goals.     Rehab Potential  Good    Clinical Impairments Affecting Rehab Potential  (+)motivated, active PLOF(-)age, comorbidities: arthritis    PT Frequency  2x / week    PT Duration  6 weeks    PT Treatment/Interventions  Electrical Stimulation;Cryotherapy;Moist Heat;Patient/family education;Neuromuscular re-education;Therapeutic exercise;Manual techniques    PT Next Visit Plan  neurmuscular re education, therapeutic exercise, manual techniques for joint, soft tissue    PT Home Exercise Plan  hip adduction with glute sets, hip abduction sitting with resistive band, SLR seated       Patient will benefit from skilled therapeutic intervention in order to improve the following deficits and impairments:  Pain, Decreased activity tolerance, Decreased endurance, Impaired perceived functional ability,  Decreased range of motion, Decreased strength  Visit Diagnosis: Left knee pain, unspecified chronicity  Muscle weakness (generalized)  Difficulty in walking, not elsewhere classified     Problem List Patient Active Problem List   Diagnosis Date Noted  . Total knee replacement status 08/17/2015    Beacher May PT 05/23/2018, 8:23 AM  [place] [facility] PHYSICAL AND SPORTS MEDICINE [address] [place], [address] Phone: [phone]   Fax:  [phone]  Name: JONNIE KUBLY MRN: [id number] Date of Birth: 1929/07/04

## 2018-05-26 ENCOUNTER — Encounter: Payer: Self-pay | Admitting: Physical Therapy

## 2018-05-26 ENCOUNTER — Ambulatory Visit: Payer: Medicare Other | Admitting: Physical Therapy

## 2018-05-26 DIAGNOSIS — M25562 Pain in left knee: Secondary | ICD-10-CM

## 2018-05-26 DIAGNOSIS — R262 Difficulty in walking, not elsewhere classified: Secondary | ICD-10-CM

## 2018-05-26 DIAGNOSIS — M6281 Muscle weakness (generalized): Secondary | ICD-10-CM

## 2018-05-26 NOTE — Therapy (Signed)
[place] [facility] [address] [place], [place], [address] Phone: [phone]   Fax:  [phone]  Physical Therapy Treatment  Patient Details  Name: Sheri Barker MRN: [id number] Date of Birth: 11-10-1928 Referring Provider: Burman FreestoneMeeler, Whitney NP   Encounter Date: 05/26/2018  PT End of Session - 05/26/18 0923    Visit Number  8    Number of Visits  12    Date for PT Re-Evaluation  06/03/18    Authorization Type  8/10 progress note    Authorization Time Period  2/6 FOTO    PT Start Time  0915    PT Stop Time  1001    PT Time Calculation (min)  46 min    Activity Tolerance  Patient tolerated treatment well    Behavior During Therapy  [facility] for tasks assessed/performed       Past Medical History:  Diagnosis Date  . Depression   . Elevated lipids   . Hypertension   . Hypothyroidism     Past Surgical History:  Procedure Laterality Date  . ABDOMINAL HYSTERECTOMY    . CATARACT EXTRACTION W/ INTRAOCULAR LENS IMPLANT Bilateral   . KNEE ARTHROPLASTY Right 08/17/2015   Procedure: COMPUTER ASSISTED TOTAL KNEE ARTHROPLASTY;  Surgeon: Donato HeinzJames P Hooten, MD;  Location: [facility] ORS;  Service: Orthopedics;  Laterality: Right;  . KNEE ARTHROSCOPY Right   . OSTEOTOMY TOES    . TONSILLECTOMY      There were no vitals filed for this visit.  Subjective Assessment - 05/26/18 0920    Subjective  Patient reports she is doing well and does not have any pain today.     Pertinent History  Patient reports she began to feel pain in left knee, thigh pain following TKA right knee 2016, about a year following her surgery. history of arthritis, hypertension controlled by medication    Limitations  Walking;Standing;Other (comment) flexion and extension of left knee    Patient Stated Goals  decreased left knee pain and be able to walk and be active again    Currently in Pain?  No/denies         Objective: Strength: Quadriceps contraction  decreased and improved from previous session Palpation: mild tenderness lateral aspect left knee    Treatment:  Modalities: US 1MHz pulsed 50% @ 1.4 w/cm2 x 10 min to lateral aspect of left knee with patient supine lying with pillow supporting LE's   Electrical stimulation: 15 min.  Russian stim. 10/10 cycle with intensity to contraction/tolerance applied (2) electrodes to quadriceps/VMO left LE with patient reclined with LE supported on pillow; pt. Performing quad sets with each cycle; goal muscle re education High volt estim.clincial program for muscle spasms  (2) electrodes applied to posterior and lateral aspect of knee left LE, intensity to tolerance with patient in reclined position with LE supported on pillow goal: pain, spasms moist heat applied to left knee in conjunction with estim: no adverse effects noted: goal: pain   Therapeutic exercise: patient performed with instruction, demonstration, verbal cues of therapist: goal: independent with home program, strength, endurance Re assessed HEP:  standing: walking side stepping along counter  walking forward and backwards along counter x 3 reps with VC   sitting:  sit to stand with ball between knees  x 5 reps with reports of no discomfort left knee  knee flexion against red resistive band without ball between knees x 15 reps   Patient response to treatment: Patient demonstrates good  understanding of HEP and demonstrated good technique with standing/walking exercises.           PT Education - 05/26/18 [phone]    Education provided  Yes    Education Details  exercise instruction/HEP    Person(s) Educated  Patient    Methods  Explanation    Comprehension  Verbalized understanding          PT Long Term Goals - 05/20/18 1355      PT LONG TERM GOAL #1   Title  Patient will demosntrate improved function with left LE for walking and daily activties with FOTO score improved to 60/100    Baseline  FOTO 54/100; FOTO 05/20/18  53/100    Status  On-going    Target Date  06/04/18      PT LONG TERM GOAL #2   Title  Patient will report mild to no pain with knee flexion/extension activities     Baseline  all movement left knee causes pain    Status  Achieved      PT LONG TERM GOAL #3   Title  Patient will be indepenent with home program for flexibility and strength for left LE for self managment of symptoms and return to prior level of function once discharged from physical therapy    Baseline  limited knowledge of appropriate exercises and progression to improve strength, flexibilty without guidance and cuing    Status  On-going    Target Date  06/04/18            Plan - 05/26/18 0945    Clinical Impression Statement  Patient continues progressing well with decreased pain in left knee and improved function for daily activities. She demonstrates good understanding of HEP.    Rehab Potential  Good    Clinical Impairments Affecting Rehab Potential  (+)motivated, active PLOF(-)age, comorbidities: arthritis    PT Frequency  2x / week    PT Duration  6 weeks    PT Treatment/Interventions  Electrical Stimulation;Cryotherapy;Moist Heat;Patient/family education;Neuromuscular re-education;Therapeutic exercise;Manual techniques    PT Next Visit Plan  neurmuscular re education, therapeutic exercise, manual techniques for joint, soft tissue    PT Home Exercise Plan  hip adduction with glute sets, hip abduction sitting with resistive band, SLR seated       Patient will benefit from skilled therapeutic intervention in order to improve the following deficits and impairments:  Pain, Decreased activity tolerance, Decreased endurance, Impaired perceived functional ability, Decreased range of motion, Decreased strength  Visit Diagnosis: Left knee pain, unspecified chronicity  Muscle weakness (generalized)  Difficulty in walking, not elsewhere classified     Problem List Patient Active Problem List   Diagnosis Date  Noted  . Total knee replacement status 08/17/2015    Beacher May PT 05/26/2018, 10:04 AM  [place] [facility] PHYSICAL AND SPORTS MEDICINE [address], [place], [address] Phone: [phone]   Fax:  [phone]  Name: Sheri Barker MRN: [id number] Date of Birth: [birth year]

## 2018-05-28 ENCOUNTER — Ambulatory Visit: Payer: Medicare Other | Admitting: Physical Therapy

## 2018-05-28 DIAGNOSIS — M25562 Pain in left knee: Secondary | ICD-10-CM

## 2018-05-28 DIAGNOSIS — M6281 Muscle weakness (generalized): Secondary | ICD-10-CM

## 2018-05-28 DIAGNOSIS — R262 Difficulty in walking, not elsewhere classified: Secondary | ICD-10-CM

## 2018-05-28 NOTE — Therapy (Signed)
[place] PHYSICAL AND SPORTS MEDICINE [address] [place], [address] Phone: [phone]   Fax:  [phone]  Physical Therapy Treatment Discharge Summary  Patient Details  Name: Sheri Barker MRN: [id number] Date of Birth: February 24, 1929 Referring Provider: Allene Dillon NP   Encounter Date: 05/28/2018   Patient began physical therapy on 04/22/2018 and has attended 9 sessions through 05/28/2018. She has achieved Goal #2,3/partially met #1 goals and is independent in home program for continued self management of pain/symptoms and exercises as instructed. Plan discharge from physical therapy at this time.    PT End of Session - 05/28/18 1021    Visit Number  9    Number of Visits  12    Date for PT Re-Evaluation  06/03/18    Authorization Type  9/10 progress note    Authorization Time Period  3/6 FOTO    PT Start Time  1020    PT Stop Time  1055    PT Time Calculation (min)  35 min    Activity Tolerance  Patient tolerated treatment well    Behavior During Therapy  WFL for tasks assessed/performed       Past Medical History:  Diagnosis Date  . Depression   . Elevated lipids   . Hypertension   . Hypothyroidism     Past Surgical History:  Procedure Laterality Date  . ABDOMINAL HYSTERECTOMY    . CATARACT EXTRACTION W/ INTRAOCULAR LENS IMPLANT Bilateral   . KNEE ARTHROPLASTY Right 08/17/2015   Procedure: COMPUTER ASSISTED TOTAL KNEE ARTHROPLASTY;  Surgeon: Dereck Leep, MD;  Location: [facility] ORS;  Service: Orthopedics;  Laterality: Right;  . KNEE ARTHROSCOPY Right   . OSTEOTOMY TOES    . TONSILLECTOMY      There were no vitals filed for this visit.      Objective: Gait: WNL ambulating without AD with good weight shift and cadence Strength: Quadriceps with improved motor control and close to full contraction achieved with estim; left hip flexion 4/5, knee extension 4/5, knee flexion 4/5 Palpation: mild tenderness lateral  aspect left knee  Outcome measure: FOTO 55/100    Treatment:  Modalities: [place] 1MHz pulsed 50% @ 1.4 w/cm2 x 10 min to lateral aspect of left knee with patient supine lying with pillow supporting LE's   Electrical stimulation: 20 min.  Russian stim. 10/10 cycle with intensity to contraction/tolerance applied (2) electrodes to quadriceps/VMO left LE with patient reclined with LE supported on pillow; pt. Performing quad sets with each cycle; goal muscle re education High volt estim.clincial program for muscle spasms  (2) electrodes applied to posterior and lateral aspect of knee left LE, intensity to tolerance with patient in reclined position with LE supported on pillow goal: pain, spasms moist heat applied to left knee in conjunction with estim: no adverse effects noted: goal: pain   Therapeutic exercise:  Re assessed HEP verbally with patient with good understanding demonstrated   Patient response to treatment: Patient demonstrates good understanding of HEP. Patient reported no pain following treatment and improved ability to walk with less difficulty                PT Education - 05/28/18 1100    Education provided  Yes    Education Details  re assessed HEP verbally    Person(s) Educated  Patient    Methods  Explanation    Comprehension  Verbalized understanding          PT Long Term  Goals - 05/28/18 1058      PT LONG TERM GOAL #1   Title  Patient will demosntrate improved function with left LE for walking and daily activties with FOTO score improved to 60/100    Baseline  FOTO 54/100; FOTO 05/20/18 53/100; 05/28/18 55/100    Status  Partially Met      PT LONG TERM GOAL #2   Title  Patient will report mild to no pain with knee flexion/extension activities     Baseline  all movement left knee causes pain    Status  Achieved      PT LONG TERM GOAL #3   Title  Patient will be indepenent with home program for flexibility and strength for left LE for self managment of  symptoms and return to prior level of function once discharged from physical therapy    Baseline  limited knowledge of appropriate exercises and progression to improve strength, flexibilty without guidance and cuing    Status  Achieved            Plan - 05/28/18 1023    Clinical Impression Statement  Patient is doing well and is independent with home program. She continues with limitations due to chronic condition of arthritis. She should be able to manage symptoms at home with exercise and pain control strategies as instructed. Her FOTO score initially was 54 and currently is 55/100 and has not changed much due to chronic condition.     Rehab Potential  Good    Clinical Impairments Affecting Rehab Potential  (+)motivated, active PLOF(-)age, comorbidities: arthritis    PT Frequency  2x / week    PT Duration  6 weeks    PT Treatment/Interventions  Electrical Stimulation;Cryotherapy;Moist Heat;Patient/family education;Neuromuscular re-education;Therapeutic exercise;Manual techniques    PT Next Visit Plan  discharge     PT Home Exercise Plan  hip adduction with glute sets, hip abduction sitting with resistive band, SLR seated    Consulted and Agree with Plan of Care  Patient       Patient will benefit from skilled therapeutic intervention in order to improve the following deficits and impairments:  Pain, Decreased activity tolerance, Decreased endurance, Impaired perceived functional ability, Decreased range of motion, Decreased strength  Visit Diagnosis: Left knee pain, unspecified chronicity  Muscle weakness (generalized)  Difficulty in walking, not elsewhere classified     Problem List Patient Active Problem List   Diagnosis Date Noted  . Total knee replacement status 08/17/2015    Jomarie Longs PT 05/29/2018, 8:24 AM  [place] PHYSICAL AND SPORTS MEDICINE [address] [place], [address] Phone: [phone]   Fax:   [phone]  Name: BRINDLEY MADARANG MRN: [id number] Date of Birth: Aug 05, 1929

## 2018-06-02 ENCOUNTER — Ambulatory Visit: Payer: Medicare Other | Admitting: Physical Therapy

## 2018-06-04 ENCOUNTER — Ambulatory Visit: Payer: Medicare Other | Admitting: Physical Therapy

## 2018-06-19 ENCOUNTER — Encounter: Payer: Self-pay | Admitting: Podiatry

## 2018-06-19 ENCOUNTER — Ambulatory Visit (INDEPENDENT_AMBULATORY_CARE_PROVIDER_SITE_OTHER): Payer: Medicare Other | Admitting: Podiatry

## 2018-06-19 VITALS — BP 125/66 | HR 87

## 2018-06-19 DIAGNOSIS — M216X2 Other acquired deformities of left foot: Secondary | ICD-10-CM | POA: Diagnosis not present

## 2018-06-19 DIAGNOSIS — M2042 Other hammer toe(s) (acquired), left foot: Secondary | ICD-10-CM

## 2018-06-19 DIAGNOSIS — T300 Burn of unspecified body region, unspecified degree: Secondary | ICD-10-CM | POA: Diagnosis not present

## 2018-06-19 DIAGNOSIS — Q828 Other specified congenital malformations of skin: Secondary | ICD-10-CM

## 2018-06-19 NOTE — Progress Notes (Signed)
This patient presents the office with chief complaint of a painful skin lesion on the bottom of her left forefoot.  She says she had a callous tissue which she applied a bandage.  She says the callus became more painful when she walks .  She  States that she is been dealing with this painful callus/skin lesion for over 2 years.  She says that she is very active for her age and does significant walking.  She gives a history of having multiple foot surgeries performed by Dr. Charlsie Merlesegal. Her second toe left foot is significantly elevated  due to previous surgery.  She presents the office today for an evaluation and treatment of this painful skin lesion/callus.  General Appearance  Alert, conversant and in no acute stress.  Vascular  Dorsalis pedis and posterior tibial  pulses are palpable  bilaterally.  Capillary return is within normal limits  bilaterally. Temperature is within normal limits  bilaterally.  Neurologic  Senn-Weinstein monofilament wire test within normal limits  bilaterally. Muscle power within normal limits bilaterally.  Nails Normal nails noted with no evidence of fungal or bacterial infection.  Orthopedic  No limitations of motion of motion feet .  No crepitus or effusions noted.  No bony pathology or digital deformities noted.  Patient has multiple contracted digits, both feet.  Specifically she has a dorsiflexed second digit left foot.  Skin  normotropic skin  noted bilaterally.  No signs of infections or ulcers noted.  Patient has a porokeratosis sub-2 left foot.  Patient also has white necrotic tissue noted at the porokeratosis site due to acid usage.  Porokeratosis secondary to plantarflexed metatarsal secondary to contracted second digit left foot.  IE.  Debridement of porokeratosis sub 2 left foot including white  necrotic tissue from acid usage.  Padding was dispensed.  RTC prn   Helane GuntherGregory Danitza Schoenfeldt DPM

## 2018-06-22 DIAGNOSIS — M1712 Unilateral primary osteoarthritis, left knee: Secondary | ICD-10-CM | POA: Insufficient documentation

## 2018-08-25 ENCOUNTER — Other Ambulatory Visit: Payer: Self-pay | Admitting: Family Medicine

## 2018-08-25 DIAGNOSIS — Z1231 Encounter for screening mammogram for malignant neoplasm of breast: Secondary | ICD-10-CM

## 2018-09-02 NOTE — Discharge Instructions (Signed)
°  Instructions after Total Knee Replacement ° ° Adithya Difrancesco P. Pansey Pinheiro, Jr., M.D.    ° Dept. of Orthopaedics & Sports Medicine ° [facility] ° [address] ° [place], [place]  [address] ° Phone: [phone]   Fax: [phone] ° °  °DIET: °• Drink plenty of non-alcoholic fluids. °• Resume your normal diet. Include foods high in fiber. ° °ACTIVITY:  °• You may use crutches or a walker with weight-bearing as tolerated, unless instructed otherwise. °• You may be weaned off of the walker or crutches by your Physical Therapist.  °• Do NOT place pillows under the knee. Anything placed under the knee could limit your ability to straighten the knee.   °• Continue doing gentle exercises. Exercising will reduce the pain and swelling, increase motion, and prevent muscle weakness.   °• Please continue to use the TED compression stockings for 6 weeks. You may remove the stockings at night, but should reapply them in the morning. °• Do not drive or operate any equipment until instructed. ° °WOUND CARE:  °• Continue to use the PolarCare or ice packs periodically to reduce pain and swelling. °• You may bathe or shower after the staples are removed at the first office visit following surgery. ° °MEDICATIONS: °• You may resume your regular medications. °• Please take the pain medication as prescribed on the medication. °• Do not take pain medication on an empty stomach. °• You have been given a prescription for a blood thinner (Lovenox or Coumadin). Please take the medication as instructed. (NOTE: After completing a 2 week course of Lovenox, take one Enteric-coated aspirin once a day. This along with elevation will help reduce the possibility of phlebitis in your operated leg.) °• Do not drive or drink alcoholic beverages when taking pain medications. ° °CALL THE OFFICE FOR: °• Temperature above 101 degrees °• Excessive bleeding or drainage on the dressing. °• Excessive swelling, coldness, or paleness of the toes. °• Persistent  nausea and vomiting. ° °FOLLOW-UP:  °• You should have an appointment to return to the office in 10-14 days after surgery. °• Arrangements have been made for continuation of Physical Therapy (either home therapy or outpatient therapy). °  °

## 2018-09-03 ENCOUNTER — Encounter
Admission: RE | Admit: 2018-09-03 | Discharge: 2018-09-03 | Disposition: A | Discharge status: Home / Self Care | Payer: Medicare Other | Source: Ambulatory Visit | Attending: Orthopedic Surgery | Admitting: Orthopedic Surgery

## 2018-09-03 ENCOUNTER — Other Ambulatory Visit: Payer: Self-pay

## 2018-09-03 DIAGNOSIS — Z01818 Encounter for other preprocedural examination: Secondary | ICD-10-CM | POA: Insufficient documentation

## 2018-09-03 HISTORY — DX: Unspecified osteoarthritis, unspecified site: M19.90

## 2018-09-03 HISTORY — DX: Unspecified glaucoma: H40.9

## 2018-09-03 HISTORY — DX: Nausea with vomiting, unspecified: R11.2

## 2018-09-03 HISTORY — DX: Other specified postprocedural states: Z98.890

## 2018-09-03 LAB — COMPREHENSIVE METABOLIC PANEL
ALBUMIN: 4.2 g/dL (ref 3.5–5.0)
ALT: 24 U/L (ref 0–44)
ANION GAP: 10 (ref 5–15)
AST: 31 U/L (ref 15–41)
Alkaline Phosphatase: 67 U/L (ref 38–126)
BUN: 18 mg/dL (ref 8–23)
CO2: 29 mmol/L (ref 22–32)
Calcium: 10.1 mg/dL (ref 8.9–10.3)
Chloride: 99 mmol/L (ref 98–111)
Creatinine, Ser: 0.67 mg/dL (ref 0.44–1.00)
GFR calc non Af Amer: >60 mL/min (ref >60)
GLUCOSE: 87 mg/dL (ref 70–99)
Potassium: 3.3 mmol/L — ABNORMAL LOW (ref 3.5–5.1)
SODIUM: 138 mmol/L (ref 135–145)
Total Bilirubin: 0.7 mg/dL (ref 0.3–1.2)
Total Protein: 7.4 g/dL (ref 6.5–8.1)

## 2018-09-03 LAB — SURGICAL PCR SCREEN
MRSA, PCR: NEGATIVE
STAPHYLOCOCCUS AUREUS: NEGATIVE

## 2018-09-03 LAB — CBC
HCT: 43.5 % (ref 36.0–46.0)
HEMOGLOBIN: 14.4 g/dL (ref 12.0–15.0)
MCH: 30.3 pg (ref 26.0–34.0)
MCHC: 33.1 g/dL (ref 30.0–36.0)
MCV: 91.6 fL (ref 80.0–100.0)
Platelets: 349 10*3/uL (ref 150–400)
RBC: 4.75 MIL/uL (ref 3.87–5.11)
RDW: 13.5 % (ref 11.5–15.5)
WBC: 8.5 10*3/uL (ref 4.0–10.5)
nRBC: 0 % (ref 0.0–0.2)

## 2018-09-03 LAB — TYPE AND SCREEN
ABO/RH(D): A NEG
Antibody Screen: NEGATIVE

## 2018-09-03 LAB — URINALYSIS, ROUTINE W REFLEX MICROSCOPIC
Bacteria, UA: NONE SEEN
Bilirubin Urine: NEGATIVE
Glucose, UA: NEGATIVE mg/dL
HGB URINE DIPSTICK: NEGATIVE
KETONES UR: NEGATIVE mg/dL
NITRITE: NEGATIVE
Protein, ur: NEGATIVE mg/dL
SPECIFIC GRAVITY, URINE: 1.008 (ref 1.005–1.030)
pH: 8 (ref 5.0–8.0)

## 2018-09-03 LAB — C-REACTIVE PROTEIN: CRP: <0.8 mg/dL (ref <1.0)

## 2018-09-03 LAB — SEDIMENTATION RATE: SED RATE: 5 mm/h (ref 0–30)

## 2018-09-03 LAB — APTT: APTT: 32 s (ref 24–36)

## 2018-09-03 LAB — PROTIME-INR
INR: 0.9
Prothrombin Time: 12.1 seconds (ref 11.4–15.2)

## 2018-09-03 NOTE — Patient Instructions (Signed)
Your procedure is scheduled on: Wednesday 09/17/18 Report to [place]. To find out your arrival time please call [phone] between 1PM - 3PM on Tuesday 09/16/18.  Remember: Instructions that are not followed completely may result in serious medical risk, up to and including death, or upon the discretion of your surgeon and anesthesiologist your surgery may need to be rescheduled.     _X__ 1. Do not eat food after midnight the night before your procedure.                 No gum chewing or hard candies. You may drink clear liquids up to 2 hours                 before you are scheduled to arrive for your surgery- DO not drink clear                 liquids within 2 hours of the start of your surgery.                 Clear Liquids include:  water, apple juice without pulp, clear carbohydrate                 drink such as Clearfast or Gatorade, Black Coffee or Tea (Do not add                 anything to coffee or tea).  __X__2.  On the morning of surgery brush your teeth with toothpaste and water, you                 may rinse your mouth with mouthwash if you wish.  Do not swallow any              toothpaste of mouthwash.     _X__ 3.  No Alcohol for 24 hours before or after surgery.   _X__ 4.  Do Not Smoke or use e-cigarettes For 24 Hours Prior to Your Surgery.                 Do not use any chewable tobacco products for at least 6 hours prior to                 surgery.  ____  5.  Bring all medications with you on the day of surgery if instructed.   __X__  6.  Notify your doctor if there is any change in your medical condition      (cold, fever, infections).     Do not wear jewelry, make-up, hairpins, clips or nail polish. Do not wear lotions, powders, or perfumes.  Do not shave 48 hours prior to surgery. Men may shave face and neck. Do not bring valuables to the hospital.    [facility] is not responsible for any belongings or  valuables.  Contacts, dentures/partials or body piercings may not be worn into surgery. Bring a case for your contacts, glasses or hearing aids, a denture cup will be supplied. Leave your suitcase in the car. After surgery it may be brought to your room. For patients admitted to the hospital, discharge time is determined by your treatment team.   Patients discharged the day of surgery will not be allowed to drive home.   Please read over the following fact sheets that you were given:   MRSA Information  __X__ Take these medicines the morning of surgery with A SIP OF WATER:  1. DULoxetine (CYMBALTA)  2. levothyroxine (SYNTHROID, LEVOTHROID)  3.   4.  5.  6.  ____ Fleet Enema (as directed)   __X__ Use CHG Soap/SAGE wipes as directed  ____ Use inhalers on the day of surgery  ____ Stop metformin/Janumet/Farxiga 2 days prior to surgery    ____ Take 1/2 of usual insulin dose the night before surgery. No insulin the morning          of surgery.   ____ Stop Blood Thinners Coumadin/Plavix/Xarelto/Pleta/Pradaxa/Eliquis/Effient/Aspirin  on   Or contact your Surgeon, Cardiologist or Medical Doctor regarding  ability to stop your blood thinners  __X__ Stop Anti-inflammatories 7 days before surgery such as Advil, Ibuprofen, Motrin,  BC or Goodies Powder, Naprosyn, Naproxen, Aleve, Aspirin, Meloxicam    __X__ Stop all herbal supplements, fish oil or vitamin E until after surgery. Red Yeast 7 days prior   ____ Bring C-Pap to the hospital.

## 2018-09-03 NOTE — Pre-Procedure Instructions (Signed)
Reviewed EKG with Dr Henrene Hawking. No new orders.

## 2018-09-05 LAB — URINE CULTURE
CULTURE: NO GROWTH
Special Requests: NORMAL

## 2018-09-06 LAB — IGE: IgE (Immunoglobulin E), Serum: 21 IU/mL (ref 6–495)

## 2018-09-16 ENCOUNTER — Encounter: Payer: Self-pay | Admitting: Orthopedic Surgery

## 2018-09-16 MED ORDER — CLINDAMYCIN PHOSPHATE 900 MG/50ML IV SOLN: 900.0000 mg | INTRAVENOUS | Status: DC

## 2018-09-16 MED ORDER — TRANEXAMIC ACID-NACL 1000-0.7 MG/100ML-% IV SOLN
1000.0000 mg | INTRAVENOUS | Status: DC
  Filled 2018-09-16: qty 100

## 2018-09-17 ENCOUNTER — Inpatient Hospital Stay: Payer: Medicare Other | Admitting: Anesthesiology

## 2018-09-17 ENCOUNTER — Encounter: Payer: Self-pay | Admitting: Emergency Medicine

## 2018-09-17 ENCOUNTER — Inpatient Hospital Stay: Payer: Medicare Other

## 2018-09-17 ENCOUNTER — Other Ambulatory Visit: Payer: Self-pay

## 2018-09-17 ENCOUNTER — Inpatient Hospital Stay
Admission: RE | Admit: 2018-09-17 | Discharge: 2018-09-19 | DRG: 470 | Disposition: A | Discharge status: Skilled Nursing Facility | Payer: Medicare Other | Attending: Orthopedic Surgery | Admitting: Orthopedic Surgery

## 2018-09-17 ENCOUNTER — Encounter
Admission: RE | Disposition: A | Discharge status: Skilled Nursing Facility | Payer: Self-pay | Source: Home / Self Care | Attending: Orthopedic Surgery

## 2018-09-17 DIAGNOSIS — M1712 Unilateral primary osteoarthritis, left knee: Secondary | ICD-10-CM | POA: Diagnosis present

## 2018-09-17 DIAGNOSIS — F329 Major depressive disorder, single episode, unspecified: Secondary | ICD-10-CM | POA: Diagnosis present

## 2018-09-17 DIAGNOSIS — Z79890 Hormone replacement therapy (postmenopausal): Secondary | ICD-10-CM | POA: Diagnosis not present

## 2018-09-17 DIAGNOSIS — E039 Hypothyroidism, unspecified: Secondary | ICD-10-CM | POA: Diagnosis present

## 2018-09-17 DIAGNOSIS — Z7982 Long term (current) use of aspirin: Secondary | ICD-10-CM

## 2018-09-17 DIAGNOSIS — I10 Essential (primary) hypertension: Secondary | ICD-10-CM | POA: Diagnosis present

## 2018-09-17 DIAGNOSIS — Z791 Long term (current) use of non-steroidal anti-inflammatories (NSAID): Secondary | ICD-10-CM | POA: Diagnosis not present

## 2018-09-17 DIAGNOSIS — Z96651 Presence of right artificial knee joint: Secondary | ICD-10-CM | POA: Diagnosis present

## 2018-09-17 DIAGNOSIS — Z8249 Family history of ischemic heart disease and other diseases of the circulatory system: Secondary | ICD-10-CM | POA: Diagnosis not present

## 2018-09-17 DIAGNOSIS — Z96659 Presence of unspecified artificial knee joint: Secondary | ICD-10-CM

## 2018-09-17 HISTORY — PX: KNEE ARTHROPLASTY: SHX992

## 2018-09-17 LAB — POCT I-STAT 4, (NA,K, GLUC, HGB,HCT)
Glucose, Bld: 110 mg/dL — ABNORMAL HIGH (ref 70–99)
HEMATOCRIT: 42 % (ref 36.0–46.0)
HEMOGLOBIN: 14.3 g/dL (ref 12.0–15.0)
Potassium: 3.3 mmol/L — ABNORMAL LOW (ref 3.5–5.1)
Sodium: 140 mmol/L (ref 135–145)

## 2018-09-17 SURGERY — ARTHROPLASTY, KNEE, TOTAL, USING IMAGELESS COMPUTER-ASSISTED NAVIGATION
Anesthesia: Spinal | Site: Knee | Laterality: Left

## 2018-09-17 MED ORDER — BUPIVACAINE LIPOSOME 1.3 % IJ SUSP
INTRAMUSCULAR | Status: AC
  Filled 2018-09-17: qty 20

## 2018-09-17 MED ORDER — CLINDAMYCIN PHOSPHATE 900 MG/50ML IV SOLN
INTRAVENOUS | Status: DC | PRN
  Administered 2018-09-17: 900 mg via INTRAVENOUS

## 2018-09-17 MED ORDER — SODIUM CHLORIDE 0.9 % IV SOLN
INTRAVENOUS | Status: DC | PRN
  Administered 2018-09-17: 30 ug/min via INTRAVENOUS

## 2018-09-17 MED ORDER — CELECOXIB 200 MG PO CAPS
400.0000 mg | ORAL_CAPSULE | Freq: Once | ORAL | Status: AC
  Administered 2018-09-17: 400 mg via ORAL

## 2018-09-17 MED ORDER — DIPHENHYDRAMINE HCL 12.5 MG/5ML PO ELIX: 12.5000 mg | ORAL_SOLUTION | ORAL | Status: DC | PRN

## 2018-09-17 MED ORDER — PROPOFOL 500 MG/50ML IV EMUL
INTRAVENOUS | Status: AC
  Filled 2018-09-17: qty 50

## 2018-09-17 MED ORDER — METOCLOPRAMIDE HCL 10 MG PO TABS: 5.0000 mg | ORAL_TABLET | Freq: Three times a day (TID) | ORAL | Status: DC | PRN

## 2018-09-17 MED ORDER — NEOMYCIN-POLYMYXIN B GU 40-200000 IR SOLN
Status: AC
  Filled 2018-09-17: qty 20

## 2018-09-17 MED ORDER — HYDROMORPHONE HCL 1 MG/ML IJ SOLN: 0.5000 mg | INTRAMUSCULAR | Status: DC | PRN

## 2018-09-17 MED ORDER — FAMOTIDINE 20 MG PO TABS
ORAL_TABLET | ORAL | Status: AC
  Filled 2018-09-17: qty 1

## 2018-09-17 MED ORDER — OXYCODONE HCL 5 MG PO TABS: 10.0000 mg | ORAL_TABLET | ORAL | Status: DC | PRN

## 2018-09-17 MED ORDER — FENTANYL CITRATE (PF) 100 MCG/2ML IJ SOLN
INTRAMUSCULAR | Status: AC
  Filled 2018-09-17: qty 2

## 2018-09-17 MED ORDER — CLINDAMYCIN PHOSPHATE 600 MG/50ML IV SOLN
600.0000 mg | Freq: Four times a day (QID) | INTRAVENOUS | Status: AC
  Administered 2018-09-17 – 2018-09-18 (×4): 600 mg via INTRAVENOUS
  Filled 2018-09-17 (×4): qty 50

## 2018-09-17 MED ORDER — BUPIVACAINE HCL (PF) 0.25 % IJ SOLN
INTRAMUSCULAR | Status: AC
  Filled 2018-09-17: qty 60

## 2018-09-17 MED ORDER — GABAPENTIN 300 MG PO CAPS
300.0000 mg | ORAL_CAPSULE | Freq: Every day | ORAL | Status: DC
  Administered 2018-09-17 – 2018-09-18 (×2): 300 mg via ORAL
  Filled 2018-09-17 (×2): qty 1

## 2018-09-17 MED ORDER — BENAZEPRIL HCL 20 MG PO TABS
20.0000 mg | ORAL_TABLET | Freq: Every day | ORAL | Status: DC
  Administered 2018-09-17 – 2018-09-19 (×3): 20 mg via ORAL
  Filled 2018-09-17 (×3): qty 1

## 2018-09-17 MED ORDER — AMLODIPINE BESY-BENAZEPRIL HCL 10-20 MG PO CAPS: 1.0000 | ORAL_CAPSULE | Freq: Every day | ORAL | Status: DC

## 2018-09-17 MED ORDER — PHENYLEPHRINE HCL 10 MG/ML IJ SOLN
INTRAMUSCULAR | Status: AC
  Filled 2018-09-17: qty 1

## 2018-09-17 MED ORDER — BUPIVACAINE HCL (PF) 0.5 % IJ SOLN
INTRAMUSCULAR | Status: AC
  Filled 2018-09-17: qty 10

## 2018-09-17 MED ORDER — NEOMYCIN-POLYMYXIN B GU 40-200000 IR SOLN
Status: DC | PRN
  Administered 2018-09-17: 14 mL

## 2018-09-17 MED ORDER — SENNOSIDES-DOCUSATE SODIUM 8.6-50 MG PO TABS
1.0000 | ORAL_TABLET | Freq: Two times a day (BID) | ORAL | Status: DC
  Administered 2018-09-17 – 2018-09-19 (×4): 1 via ORAL
  Filled 2018-09-17 (×4): qty 1

## 2018-09-17 MED ORDER — FERROUS SULFATE 325 (65 FE) MG PO TABS
325.0000 mg | ORAL_TABLET | Freq: Two times a day (BID) | ORAL | Status: DC
  Administered 2018-09-17 – 2018-09-19 (×4): 325 mg via ORAL
  Filled 2018-09-17 (×4): qty 1

## 2018-09-17 MED ORDER — ACETAMINOPHEN 10 MG/ML IV SOLN
1000.0000 mg | Freq: Four times a day (QID) | INTRAVENOUS | Status: AC
  Administered 2018-09-17 – 2018-09-18 (×4): 1000 mg via INTRAVENOUS
  Filled 2018-09-17 (×4): qty 100

## 2018-09-17 MED ORDER — HYDROCHLOROTHIAZIDE 25 MG PO TABS
12.5000 mg | ORAL_TABLET | Freq: Every day | ORAL | Status: DC
  Administered 2018-09-17 – 2018-09-19 (×3): 12.5 mg via ORAL
  Filled 2018-09-17 (×3): qty 1

## 2018-09-17 MED ORDER — MAGNESIUM HYDROXIDE 400 MG/5ML PO SUSP
30.0000 mL | Freq: Every day | ORAL | Status: DC
  Administered 2018-09-17 – 2018-09-18 (×2): 30 mL via ORAL
  Filled 2018-09-17 (×2): qty 30

## 2018-09-17 MED ORDER — SODIUM CHLORIDE 0.9 % IV SOLN
INTRAVENOUS | Status: DC | PRN
  Administered 2018-09-17: 60 mL

## 2018-09-17 MED ORDER — AMLODIPINE BESYLATE 10 MG PO TABS
10.0000 mg | ORAL_TABLET | Freq: Every day | ORAL | Status: DC
  Administered 2018-09-17 – 2018-09-19 (×3): 10 mg via ORAL
  Filled 2018-09-17 (×3): qty 1

## 2018-09-17 MED ORDER — ENOXAPARIN SODIUM 30 MG/0.3ML ~~LOC~~ SOLN
30.0000 mg | Freq: Two times a day (BID) | SUBCUTANEOUS | Status: DC
  Administered 2018-09-18 – 2018-09-19 (×3): 30 mg via SUBCUTANEOUS
  Filled 2018-09-17 (×3): qty 0.3

## 2018-09-17 MED ORDER — DEXAMETHASONE SODIUM PHOSPHATE 10 MG/ML IJ SOLN
8.0000 mg | Freq: Once | INTRAMUSCULAR | Status: AC
  Administered 2018-09-17: 8 mg via INTRAVENOUS

## 2018-09-17 MED ORDER — TRAMADOL HCL 50 MG PO TABS
50.0000 mg | ORAL_TABLET | ORAL | Status: DC | PRN
  Administered 2018-09-17 – 2018-09-19 (×6): 100 mg via ORAL
  Filled 2018-09-17 (×7): qty 2

## 2018-09-17 MED ORDER — DEXAMETHASONE SODIUM PHOSPHATE 10 MG/ML IJ SOLN
INTRAMUSCULAR | Status: AC
  Filled 2018-09-17: qty 1

## 2018-09-17 MED ORDER — LEVOTHYROXINE SODIUM 75 MCG PO TABS
75.0000 ug | ORAL_TABLET | Freq: Every day | ORAL | Status: DC
  Administered 2018-09-18 – 2018-09-19 (×2): 75 ug via ORAL
  Filled 2018-09-17: qty 3
  Filled 2018-09-17: qty 1
  Filled 2018-09-17: qty 3
  Filled 2018-09-17: qty 1

## 2018-09-17 MED ORDER — CLINDAMYCIN PHOSPHATE 900 MG/50ML IV SOLN
INTRAVENOUS | Status: AC
  Filled 2018-09-17: qty 50

## 2018-09-17 MED ORDER — FAMOTIDINE 20 MG PO TABS
20.0000 mg | ORAL_TABLET | Freq: Once | ORAL | Status: AC
  Administered 2018-09-17: 20 mg via ORAL

## 2018-09-17 MED ORDER — TRANEXAMIC ACID-NACL 1000-0.7 MG/100ML-% IV SOLN
INTRAVENOUS | Status: DC | PRN
  Administered 2018-09-17: 1000 mg via INTRAVENOUS

## 2018-09-17 MED ORDER — CHLORHEXIDINE GLUCONATE 4 % EX LIQD: 60.0000 mL | Freq: Once | CUTANEOUS | Status: DC

## 2018-09-17 MED ORDER — PANTOPRAZOLE SODIUM 40 MG PO TBEC
40.0000 mg | DELAYED_RELEASE_TABLET | Freq: Two times a day (BID) | ORAL | Status: DC
  Administered 2018-09-17 – 2018-09-19 (×4): 40 mg via ORAL
  Filled 2018-09-17 (×4): qty 1

## 2018-09-17 MED ORDER — DULOXETINE HCL 60 MG PO CPEP
60.0000 mg | ORAL_CAPSULE | Freq: Every day | ORAL | Status: DC
  Administered 2018-09-18 – 2018-09-19 (×2): 60 mg via ORAL
  Filled 2018-09-17 (×2): qty 1

## 2018-09-17 MED ORDER — GABAPENTIN 300 MG PO CAPS
300.0000 mg | ORAL_CAPSULE | Freq: Once | ORAL | Status: AC
  Administered 2018-09-17: 300 mg via ORAL

## 2018-09-17 MED ORDER — FENTANYL CITRATE (PF) 100 MCG/2ML IJ SOLN
INTRAMUSCULAR | Status: DC | PRN
  Administered 2018-09-17: 50 ug via INTRAVENOUS

## 2018-09-17 MED ORDER — TRANEXAMIC ACID-NACL 1000-0.7 MG/100ML-% IV SOLN
1000.0000 mg | Freq: Once | INTRAVENOUS | Status: AC
  Administered 2018-09-17: 1000 mg via INTRAVENOUS
  Filled 2018-09-17: qty 100

## 2018-09-17 MED ORDER — BUPIVACAINE HCL (PF) 0.25 % IJ SOLN
INTRAMUSCULAR | Status: DC | PRN
  Administered 2018-09-17: 60 mL

## 2018-09-17 MED ORDER — ADULT MULTIVITAMIN W/MINERALS CH
1.0000 | ORAL_TABLET | Freq: Every day | ORAL | Status: DC
  Administered 2018-09-18 – 2018-09-19 (×2): 1 via ORAL
  Filled 2018-09-17 (×2): qty 1

## 2018-09-17 MED ORDER — MENTHOL 3 MG MT LOZG: 1.0000 | LOZENGE | OROMUCOSAL | Status: DC | PRN

## 2018-09-17 MED ORDER — PROPOFOL 500 MG/50ML IV EMUL
INTRAVENOUS | Status: DC | PRN
  Administered 2018-09-17: 40 ug/kg/min via INTRAVENOUS

## 2018-09-17 MED ORDER — METOCLOPRAMIDE HCL 10 MG PO TABS
10.0000 mg | ORAL_TABLET | Freq: Three times a day (TID) | ORAL | Status: DC
  Administered 2018-09-17 – 2018-09-19 (×7): 10 mg via ORAL
  Filled 2018-09-17 (×7): qty 1

## 2018-09-17 MED ORDER — PHENOL 1.4 % MT LIQD: 1.0000 | OROMUCOSAL | Status: DC | PRN

## 2018-09-17 MED ORDER — ACETAMINOPHEN 10 MG/ML IV SOLN
INTRAVENOUS | Status: DC | PRN
  Administered 2018-09-17: 1000 mg via INTRAVENOUS

## 2018-09-17 MED ORDER — OXYCODONE HCL 5 MG PO TABS
5.0000 mg | ORAL_TABLET | ORAL | Status: DC | PRN
  Administered 2018-09-17: 5 mg via ORAL
  Filled 2018-09-17: qty 1

## 2018-09-17 MED ORDER — FLEET ENEMA 7-19 GM/118ML RE ENEM: 1.0000 | ENEMA | Freq: Once | RECTAL | Status: DC | PRN

## 2018-09-17 MED ORDER — CELECOXIB 200 MG PO CAPS
ORAL_CAPSULE | ORAL | Status: AC
  Filled 2018-09-17: qty 2

## 2018-09-17 MED ORDER — BUPIVACAINE HCL (PF) 0.5 % IJ SOLN
INTRAMUSCULAR | Status: DC | PRN
  Administered 2018-09-17: 3 mL

## 2018-09-17 MED ORDER — ACETAMINOPHEN 325 MG PO TABS: 325.0000 mg | ORAL_TABLET | Freq: Four times a day (QID) | ORAL | Status: DC | PRN

## 2018-09-17 MED ORDER — ONDANSETRON HCL 4 MG/2ML IJ SOLN: 4.0000 mg | Freq: Four times a day (QID) | INTRAMUSCULAR | Status: DC | PRN

## 2018-09-17 MED ORDER — LACTATED RINGERS IV SOLN
INTRAVENOUS | Status: DC
  Administered 2018-09-17: 07:00:00 via INTRAVENOUS

## 2018-09-17 MED ORDER — LATANOPROST 0.005 % OP SOLN
1.0000 [drp] | Freq: Every day | OPHTHALMIC | Status: DC
  Administered 2018-09-17 – 2018-09-18 (×2): 1 [drp] via OPHTHALMIC
  Filled 2018-09-17: qty 2.5

## 2018-09-17 MED ORDER — LIDOCAINE HCL (PF) 2 % IJ SOLN
INTRAMUSCULAR | Status: AC
  Filled 2018-09-17: qty 10

## 2018-09-17 MED ORDER — BISACODYL 10 MG RE SUPP
10.0000 mg | Freq: Every day | RECTAL | Status: DC | PRN
  Administered 2018-09-19: 10 mg via RECTAL
  Filled 2018-09-17: qty 1

## 2018-09-17 MED ORDER — ONDANSETRON HCL 4 MG PO TABS: 4.0000 mg | ORAL_TABLET | Freq: Four times a day (QID) | ORAL | Status: DC | PRN

## 2018-09-17 MED ORDER — SODIUM CHLORIDE (PF) 0.9 % IJ SOLN
INTRAMUSCULAR | Status: AC
  Filled 2018-09-17: qty 50

## 2018-09-17 MED ORDER — ALUM & MAG HYDROXIDE-SIMETH 200-200-20 MG/5ML PO SUSP: 30.0000 mL | ORAL | Status: DC | PRN

## 2018-09-17 MED ORDER — PROPOFOL 10 MG/ML IV BOLUS
INTRAVENOUS | Status: DC | PRN
  Administered 2018-09-17 (×4): 15 mg via INTRAVENOUS

## 2018-09-17 MED ORDER — METOCLOPRAMIDE HCL 5 MG/ML IJ SOLN: 5.0000 mg | Freq: Three times a day (TID) | INTRAMUSCULAR | Status: DC | PRN

## 2018-09-17 MED ORDER — CELECOXIB 200 MG PO CAPS
200.0000 mg | ORAL_CAPSULE | Freq: Two times a day (BID) | ORAL | Status: DC
  Administered 2018-09-17 – 2018-09-19 (×5): 200 mg via ORAL
  Filled 2018-09-17 (×5): qty 1

## 2018-09-17 MED ORDER — GABAPENTIN 300 MG PO CAPS
ORAL_CAPSULE | ORAL | Status: AC
  Filled 2018-09-17: qty 1

## 2018-09-17 MED ORDER — SODIUM CHLORIDE 0.9 % IV SOLN
INTRAVENOUS | Status: DC
  Administered 2018-09-17 – 2018-09-18 (×2): via INTRAVENOUS

## 2018-09-17 MED ORDER — ACETAMINOPHEN 10 MG/ML IV SOLN
INTRAVENOUS | Status: AC
  Filled 2018-09-17: qty 100

## 2018-09-17 SURGICAL SUPPLY — 75 items
BATTERY INSTRU NAVIGATION (MISCELLANEOUS) ×12 IMPLANT
BLADE SAW 70X12.5 (BLADE) ×3 IMPLANT
BLADE SAW 90X13X1.19 OSCILLAT (BLADE) ×3 IMPLANT
BLADE SAW 90X25X1.19 OSCILLAT (BLADE) ×3 IMPLANT
BTRY SRG DRVR LF (MISCELLANEOUS) ×4
CANISTER SUCT 1200ML W/VALVE (MISCELLANEOUS) ×3 IMPLANT
CANISTER SUCT 3000ML PPV (MISCELLANEOUS) ×6 IMPLANT
CEMENT HV SMART SET (Cement) ×6 IMPLANT
CEMENT TIBIA MBT SIZE 2 (Knees) IMPLANT
COOLER POLAR GLACIER W/PUMP (MISCELLANEOUS) ×3 IMPLANT
COVER WAND RF STERILE (DRAPES) ×1 IMPLANT
CUFF TOURN 24 STER (MISCELLANEOUS) ×2 IMPLANT
CUFF TOURN 30 STER DUAL PORT (MISCELLANEOUS) IMPLANT
DRAPE SHEET LG 3/4 BI-LAMINATE (DRAPES) ×3 IMPLANT
DRSG DERMACEA 8X12 NADH (GAUZE/BANDAGES/DRESSINGS) ×3 IMPLANT
DRSG OPSITE POSTOP 4X14 (GAUZE/BANDAGES/DRESSINGS) ×3 IMPLANT
DRSG TEGADERM 4X4.75 (GAUZE/BANDAGES/DRESSINGS) ×3 IMPLANT
DURAPREP 26ML APPLICATOR (WOUND CARE) ×6 IMPLANT
ELECT CAUTERY BLADE 6.4 (BLADE) ×3 IMPLANT
ELECT REM PT RETURN 9FT ADLT (ELECTROSURGICAL) ×3
ELECTRODE REM PT RTRN 9FT ADLT (ELECTROSURGICAL) ×1 IMPLANT
EX-PIN ORTHOLOCK NAV 4X150 (PIN) ×6 IMPLANT
FEMUR SIGMA PS SZ 2.5 L (Knees) ×2 IMPLANT
GLOVE BIOGEL M STRL SZ7.5 (GLOVE) ×6 IMPLANT
GLOVE BIOGEL PI IND STRL 7.5 (GLOVE) IMPLANT
GLOVE BIOGEL PI IND STRL 9 (GLOVE) ×1 IMPLANT
GLOVE BIOGEL PI INDICATOR 7.5 (GLOVE) ×12
GLOVE BIOGEL PI INDICATOR 9 (GLOVE) ×2
GLOVE INDICATOR 8.0 STRL GRN (GLOVE) ×3 IMPLANT
GLOVE SURG SYN 9.0  PF PI (GLOVE) ×2
GLOVE SURG SYN 9.0 PF PI (GLOVE) ×1 IMPLANT
GOWN STRL REUS W/ TWL LRG LVL3 (GOWN DISPOSABLE) ×2 IMPLANT
GOWN STRL REUS W/TWL 2XL LVL3 (GOWN DISPOSABLE) ×3 IMPLANT
GOWN STRL REUS W/TWL LRG LVL3 (GOWN DISPOSABLE) ×6
HEMOVAC 400CC 10FR (MISCELLANEOUS) ×3 IMPLANT
HOLDER FOLEY CATH W/STRAP (MISCELLANEOUS) ×3 IMPLANT
HOOD PEEL AWAY FLYTE STAYCOOL (MISCELLANEOUS) ×6 IMPLANT
KIT TURNOVER KIT A (KITS) ×3 IMPLANT
KNIFE SCULPS 14X20 (INSTRUMENTS) ×3 IMPLANT
LABEL OR SOLS (LABEL) ×3 IMPLANT
NDL SAFETY ECLIPSE 18X1.5 (NEEDLE) ×1 IMPLANT
NDL SPNL 20GX3.5 QUINCKE YW (NEEDLE) ×2 IMPLANT
NEEDLE HYPO 18GX1.5 SHARP (NEEDLE) ×3
NEEDLE SPNL 20GX3.5 QUINCKE YW (NEEDLE) ×6 IMPLANT
NS IRRIG 500ML POUR BTL (IV SOLUTION) ×3 IMPLANT
PACK TOTAL KNEE (MISCELLANEOUS) ×3 IMPLANT
PAD WRAPON POLAR KNEE (MISCELLANEOUS) ×1 IMPLANT
PATELLA DOME PFC 35MM (Knees) ×2 IMPLANT
PENCIL SMOKE ULTRAEVAC 22 CON (MISCELLANEOUS) ×3 IMPLANT
PIN DRILL QUICK PACK ×3 IMPLANT
PIN FIXATION 1/8DIA X 3INL (PIN) ×9 IMPLANT
PLATE ROT INSERT 10MM SIZE 2.5 (Plate) ×2 IMPLANT
PULSAVAC PLUS IRRIG FAN TIP (DISPOSABLE) ×3
SOL .9 NS 3000ML IRR  AL (IV SOLUTION) ×2
SOL .9 NS 3000ML IRR AL (IV SOLUTION) ×1
SOL .9 NS 3000ML IRR UROMATIC (IV SOLUTION) ×1 IMPLANT
SOL PREP PVP 2OZ (MISCELLANEOUS) ×3
SOLUTION PREP PVP 2OZ (MISCELLANEOUS) ×1 IMPLANT
SPONGE DRAIN TRACH 4X4 STRL 2S (GAUZE/BANDAGES/DRESSINGS) ×3 IMPLANT
STAPLER SKIN PROX 35W (STAPLE) ×3 IMPLANT
STRAP TIBIA SHORT (MISCELLANEOUS) ×3 IMPLANT
SUCTION FRAZIER HANDLE 10FR (MISCELLANEOUS) ×2
SUCTION TUBE FRAZIER 10FR DISP (MISCELLANEOUS) ×1 IMPLANT
SUT VIC AB 0 CT1 36 (SUTURE) ×3 IMPLANT
SUT VIC AB 1 CT1 36 (SUTURE) ×6 IMPLANT
SUT VIC AB 2-0 CT2 27 (SUTURE) ×3 IMPLANT
SYR 20CC LL (SYRINGE) ×3 IMPLANT
SYR 30ML LL (SYRINGE) ×6 IMPLANT
TIBIA MBT CEMENT SIZE 2 (Knees) ×3 IMPLANT
TIP FAN IRRIG PULSAVAC PLUS (DISPOSABLE) ×1 IMPLANT
TOWEL OR 17X26 4PK STRL BLUE (TOWEL DISPOSABLE) ×3 IMPLANT
TOWER CARTRIDGE SMART MIX (DISPOSABLE) ×3 IMPLANT
TRAY FOLEY MTR SLVR 16FR STAT (SET/KITS/TRAYS/PACK) ×3 IMPLANT
TRAY FOLEY SLVR 16FR LF STAT (SET/KITS/TRAYS/PACK) ×2 IMPLANT
WRAPON POLAR PAD KNEE (MISCELLANEOUS) ×3

## 2018-09-17 NOTE — Evaluation (Signed)
Physical Therapy Evaluation Patient Details Name: Sheri Barker MRN: [id number] DOB: 20-Aug-1929 Today's Date: 09/17/2018   History of Present Illness  Pt is a 84 y/o F s/p L TKA.   Clinical Impression  Pt is s/p L TKA resulting in the deficits listed below (see PT Problem List).  Sheri Barker was able to perform SLR x10 without assist.  She does require assist to stand which is not her baseline.  She lives alone and will only have intermittent assist available from son at d/c.  Given pt's current mobility status, recommending SNF at d/c.  Pt will benefit from skilled PT to increase their independence and safety with mobility to allow discharge to the venue listed below.     Follow Up Recommendations SNF    Equipment Recommendations  None recommended by PT    Recommendations for Other Services       Precautions / Restrictions Precautions Precautions: Fall;Knee Precaution Booklet Issued: Yes (comment) Precaution Comments: Instructed pt in no pillow under knee and provided pt with handout Required Braces or Orthoses: Knee Immobilizer - Left Knee Immobilizer - Left: Other (comment)("if pt unable to perform SLR") Restrictions Weight Bearing Restrictions: Yes LLE Weight Bearing: Weight bearing as tolerated      Mobility  Bed Mobility Overal bed mobility: Needs Assistance Bed Mobility: Supine to Sit     Supine to sit: Min guard;HOB elevated     General bed mobility comments: Pt performs without proper technique.  Pt uses bed rail to assist in elevating trunk.    Transfers Overall transfer level: Needs assistance Equipment used: Rolling walker (2 wheeled) Transfers: Sit to/from UGI CorporationStand;Stand Pivot Transfers Sit to Stand: Min assist Stand pivot transfers: Min guard       General transfer comment: Pt requires min assist to boost to standing.  Cues for proper hand placement and safe technique. To pivot pt requires cues for sequencing.   Ambulation/Gait              General Gait Details: No assessed this visit.   Stairs            Wheelchair Mobility    Modified Rankin (Stroke Patients Only)       Balance Overall balance assessment: Needs assistance Sitting-balance support: Feet supported;No upper extremity supported Sitting balance-Leahy Scale: Good     Standing balance support: Bilateral upper extremity supported;During functional activity Standing balance-Leahy Scale: Poor Standing balance comment: Pt relies on BUE support for static and dynamic activities                             Pertinent Vitals/Pain Pain Assessment: Faces Faces Pain Scale: Hurts a little bit Pain Location: L knee Pain Descriptors / Indicators: Discomfort Pain Intervention(s): Limited activity within patient's tolerance;Monitored during session;Premedicated before session;Repositioned;Ice applied;Utilized relaxation techniques    Home Living Family/patient expects to be discharged to:: Skilled nursing facility Living Arrangements: Alone Available Help at Discharge: Family;Available PRN/intermittently Type of Home: House Home Access: Stairs to enter Entrance Stairs-Rails: Left Entrance Stairs-Number of Steps: 2 Home Layout: Two level;Able to live on main level with bedroom/bathroom Home Equipment: Dan HumphreysWalker - 2 wheels;Cane - quad;Bedside commode;Grab bars - tub/shower;Shower seat - built in Additional Comments: Daughter able to provide 24/7 assist until 11/15 but then pt will only have intermittent assist from son.      Prior Function Level of Independence: Independent         Comments: Ambulating up to 1  mile without AD.  No falls in the past 6 months.  Ind with ADLs.      Hand Dominance        Extremity/Trunk Assessment   Upper Extremity Assessment Upper Extremity Assessment: Overall WFL for tasks assessed    Lower Extremity Assessment Lower Extremity Assessment: LLE deficits/detail LLE Deficits / Details: Pt able to perform  SLR x10 without assist.        Communication   Communication: No difficulties  Cognition Arousal/Alertness: Awake/alert Behavior During Therapy: WFL for tasks assessed/performed Overall Cognitive Status: Within Functional Limits for tasks assessed                                        General Comments General comments (skin integrity, edema, etc.): BP monitored throughout session with pt reporting "wooziness" that does not change which she believes is due to pain medication.  BP in supine: 121/70, in sitting EOB 116/59, sitting EOB after exercise 123/103, standing at bedside 137/75.     Exercises Total Joint Exercises Quad Sets: Strengthening;Both;10 reps;Supine Straight Leg Raises: AROM;Strengthening;Left;10 reps;Supine Knee Flexion: AAROM;Left;Other reps (comment);Seated;Other (comment)(with 5 second holds) Goniometric ROM: 0-92 deg   Assessment/Plan    PT Assessment Patient needs continued PT services  PT Problem List Decreased strength;Decreased range of motion;Decreased activity tolerance;Decreased balance;Decreased mobility;Decreased knowledge of use of DME;Decreased safety awareness;Decreased knowledge of precautions;Pain       PT Treatment Interventions DME instruction;Gait training;Stair training;Functional mobility training;Therapeutic activities;Therapeutic exercise;Neuromuscular re-education;Balance training;Patient/family education;Modalities    PT Goals (Current goals can be found in the Care Plan section)  Acute Rehab PT Goals Patient Stated Goal: rehab before home PT Goal Formulation: With patient Time For Goal Achievement: 10/01/18 Potential to Achieve Goals: Good    Frequency BID   Barriers to discharge Decreased caregiver support Only intermittent assist available, pt lives alone    Co-evaluation               AM-PAC PT "6 Clicks" Daily Activity  Outcome Measure Difficulty turning over in bed (including adjusting bedclothes,  sheets and blankets)?: A Little Difficulty moving from lying on back to sitting on the side of the bed? : Unable Difficulty sitting down on and standing up from a chair with arms (e.g., wheelchair, bedside commode, etc,.)?: Unable Help needed moving to and from a bed to chair (including a wheelchair)?: A Little Help needed walking in hospital room?: A Lot Help needed climbing 3-5 steps with a railing? : Total 6 Click Score: 11    End of Session Equipment Utilized During Treatment: Gait belt Activity Tolerance: Patient tolerated treatment well Patient left: in chair;with call bell/phone within reach;with chair alarm set;with SCD's reapplied(with polar care and bone foam in place) Nurse Communication: Mobility status;Weight bearing status;Other (comment)(BP readings) PT Visit Diagnosis: Pain;Muscle weakness (generalized) (M62.81);Unsteadiness on feet (R26.81);Other abnormalities of gait and mobility (R26.89) Pain - Right/Left: Left Pain - part of body: Knee    Time: 1610-9604 PT Time Calculation (min) (ACUTE ONLY): 45 min   Charges:   PT Evaluation $PT Eval Moderate Complexity: 1 Mod PT Treatments $Therapeutic Exercise: 8-22 mins $Therapeutic Activity: 8-22 mins        Encarnacion Chu PT, DPT 09/17/2018, 5:38 PM

## 2018-09-17 NOTE — Plan of Care (Signed)

## 2018-09-17 NOTE — Anesthesia Post-op Follow-up Note (Signed)
Anesthesia QCDR form completed.        

## 2018-09-17 NOTE — H&P (Signed)
The patient has been re-examined, and the chart reviewed, and there have been no interval changes to the documented history and physical.    The risks, benefits, and alternatives have been discussed at length. The patient expressed understanding of the risks benefits and agreed with plans for surgical intervention.  Charleen Madera P. Raymonda Pell, Jr. M.D.    

## 2018-09-17 NOTE — Transfer of Care (Signed)
Immediate Anesthesia Transfer of Care Note  Patient: Sheri HeaterMary W Courington  Procedure(s) Performed: COMPUTER ASSISTED TOTAL KNEE ARTHROPLASTY (Left Knee)  Patient Location: PACU  Anesthesia Type:Spinal  Level of Consciousness: awake, alert , oriented and patient cooperative  Airway & Oxygen Therapy: Patient Spontanous Breathing and Patient connected to nasal cannula oxygen  Post-op Assessment: Report given to RN and Post -op Vital signs reviewed and stable  Post vital signs: Reviewed and stable  Last Vitals:  Vitals Value Taken Time  BP    Temp    Pulse 79 09/17/2018 10:45 AM  Resp 19 09/17/2018 10:45 AM  SpO2 98 % 09/17/2018 10:45 AM  Vitals shown include unvalidated device data.  Last Pain:  Vitals:   09/17/18 0629  TempSrc: Tympanic  PainSc: 0-No pain         Complications: No apparent anesthesia complications

## 2018-09-17 NOTE — Op Note (Signed)
OPERATIVE NOTE  DATE OF SURGERY:  09/17/2018  PATIENT NAME:  Sheri Barker   DOB: [birth year]  MRN: [id number]  PRE-OPERATIVE DIAGNOSIS: Degenerative arthrosis of the left knee, primary  POST-OPERATIVE DIAGNOSIS:  Same  PROCEDURE:  Left total knee arthroplasty using computer-assisted navigation  SURGEON:  Sheri Barker, Jr. M.D.  ASSISTANT:  Van ClinesJon Wolfe, PA (present and scrubbed throughout the case, critical for assistance with exposure, retraction, instrumentation, and closure)  ANESTHESIA: spinal  ESTIMATED BLOOD LOSS: 50 mL  FLUIDS REPLACED: 600 mL of crystalloid  TOURNIQUET TIME: 73 minutes  DRAINS: 2 medium Hemovac drains  SOFT TISSUE RELEASES: Anterior cruciate ligament, posterior cruciate ligament, deep medial collateral ligament, patellofemoral ligament  IMPLANTS UTILIZED: DePuy PFC Sigma size 2.5 posterior stabilized femoral component (cemented), size 2 MBT tibial component (cemented), 35 mm 3 peg oval dome patella (cemented), and a 10 mm stabilized rotating platform polyethylene insert.  INDICATIONS FOR SURGERY: Sheri IronMary W Ambrosio is a 84 y.o. year old female with a long history of progressive knee pain. X-rays demonstrated severe degenerative changes in tricompartmental fashion. The patient had not seen any significant improvement despite conservative nonsurgical intervention. After discussion of the risks and benefits of surgical intervention, the patient expressed understanding of the risks benefits and agree with plans for total knee arthroplasty.   The risks, benefits, and alternatives were discussed at length including but not limited to the risks of infection, bleeding, nerve injury, stiffness, blood clots, the need for revision surgery, cardiopulmonary complications, among others, and they were willing to proceed.  PROCEDURE IN DETAIL: The patient was brought into the operating room and, after adequate spinal anesthesia was achieved, a tourniquet was placed on the  patient's upper thigh. The patient's knee and leg were cleaned and prepped with alcohol and DuraPrep and draped in the usual sterile fashion. A "timeout" was performed as per usual protocol. The lower extremity was exsanguinated using an Esmarch, and the tourniquet was inflated to 300 mmHg. An anterior longitudinal incision was made followed by a standard mid vastus approach. The deep fibers of the medial collateral ligament were elevated in a subperiosteal fashion off of the medial flare of the tibia so as to maintain a continuous soft tissue sleeve. The patella was subluxed laterally and the patellofemoral ligament was incised. Inspection of the knee demonstrated severe degenerative changes with full-thickness loss of articular cartilage. Osteophytes were debrided using a rongeur. Anterior and posterior cruciate ligaments were excised. Two 4.0 mm Schanz pins were inserted in the femur and into the tibia for attachment of the array of trackers used for computer-assisted navigation. Hip center was identified using a circumduction technique. Distal landmarks were mapped using the computer. The distal femur and proximal tibia were mapped using the computer. The distal femoral cutting guide was positioned using computer-assisted navigation so as to achieve a 5 distal valgus cut. The femur was sized and it was felt that a size 2.5 femoral component was appropriate. A size 2.5 femoral cutting guide was positioned and the anterior cut was performed and verified using the computer. This was followed by completion of the posterior and chamfer cuts. Femoral cutting guide for the central box was then positioned in the center box cut was performed.  Attention was then directed to the proximal tibia. Medial and lateral menisci were excised. The extramedullary tibial cutting guide was positioned using computer-assisted navigation so as to achieve a 0 varus-valgus alignment and 0 posterior slope. The cut was performed and  verified using the computer. The  proximal tibia was sized and it was felt that a size 2 tibial tray was appropriate. Tibial and femoral trials were inserted followed by insertion of a 10 mm polyethylene insert. This allowed for excellent mediolateral soft tissue balancing both in flexion and in full extension. Finally, the patella was cut and prepared so as to accommodate a 35 mm 3 peg oval dome patella. A patella trial was placed and the knee was placed through a range of motion with excellent patellar tracking appreciated. The femoral trial was removed after debridement of posterior osteophytes. The central post-hole for the tibial component was reamed followed by insertion of a keel punch. Tibial trials were then removed. Cut surfaces of bone were irrigated with copious amounts of normal saline with antibiotic solution using pulsatile lavage and then suctioned dry. Polymethylmethacrylate cement was prepared in the usual fashion using a vacuum mixer. Cement was applied to the cut surface of the proximal tibia as well as along the undersurface of a size 2 MBT tibial component. Tibial component was positioned and impacted into place. Excess cement was removed using Personal assistant. Cement was then applied to the cut surfaces of the femur as well as along the posterior flanges of the size 2.5 femoral component. The femoral component was positioned and impacted into place. Excess cement was removed using Personal assistant. A 10 mm polyethylene trial was inserted and the knee was brought into full extension with steady axial compression applied. Finally, cement was applied to the backside of a 35 mm 3 peg oval dome patella and the patellar component was positioned and patellar clamp applied. Excess cement was removed using Personal assistant. After adequate curing of the cement, the tourniquet was deflated after a total tourniquet time of 73 minutes. Hemostasis was achieved using electrocautery. The knee was irrigated with  copious amounts of normal saline with antibiotic solution using pulsatile lavage and then suctioned dry. 20 mL of 1.3% Exparel and 60 mL of 0.25% Marcaine in 40 mL of normal saline was injected along the posterior capsule, medial and lateral gutters, and along the arthrotomy site. A 10 mm stabilized rotating platform polyethylene insert was inserted and the knee was placed through a range of motion with excellent mediolateral soft tissue balancing appreciated and excellent patellar tracking noted. 2 medium drains were placed in the wound bed and brought out through separate stab incisions. The medial parapatellar portion of the incision was reapproximated using interrupted sutures of #1 Vicryl. Subcutaneous tissue was approximated in layers using first #0 Vicryl followed #2-0 Vicryl. The skin was approximated with skin staples. A sterile dressing was applied.  The patient tolerated the procedure well and was transported to the recovery room in stable condition.    Patt Steinhardt P. Angie Fava., M.D.

## 2018-09-17 NOTE — Anesthesia Procedure Notes (Signed)
Spinal  Patient location during procedure: OR Start time: 09/17/2018 7:21 AM End time: 09/17/2018 7:24 AM Staffing Resident/CRNA: Bernardo Heater, CRNA Performed: resident/CRNA  Preanesthetic Checklist Completed: patient identified, site marked, surgical consent, pre-op evaluation, timeout performed, IV checked, risks and benefits discussed and monitors and equipment checked Spinal Block Patient position: sitting Prep: ChloraPrep Patient monitoring: heart rate, continuous pulse ox, blood pressure and cardiac monitor Approach: midline Location: L3-4 Injection technique: single-shot Needle Needle type: Introducer and Pencan  Needle gauge: 24 G Needle length: 9 cm Additional Notes Negative paresthesia. Negative blood return. Positive free-flowing CSF. Expiration date of kit checked and confirmed. Patient tolerated procedure well, without complications.

## 2018-09-17 NOTE — Anesthesia Preprocedure Evaluation (Signed)
Anesthesia Evaluation  Patient identified by MRN, date of birth, ID band Patient awake    Reviewed: Allergy & Precautions, H&P , NPO status , Patient's Chart, lab work & pertinent test results  History of Anesthesia Complications (+) PONV and history of anesthetic complications  Airway Mallampati: III  TM Distance: >3 FB Neck ROM: limited    Dental  (+) Poor Dentition   Pulmonary neg pulmonary ROS, neg shortness of breath, neg sleep apnea,    Pulmonary exam normal breath sounds clear to auscultation       Cardiovascular Exercise Tolerance: Good hypertension, (-) angina(-) Past MI and (-) Orthopnea Normal cardiovascular exam Rhythm:regular Rate:Normal     Neuro/Psych PSYCHIATRIC DISORDERS Depression  Neuromuscular disease    GI/Hepatic negative GI ROS, Neg liver ROS, neg GERD  ,  Endo/Other  Hypothyroidism   Renal/GU negative Renal ROS  negative genitourinary   Musculoskeletal  (+) Arthritis ,   Abdominal   Peds  Hematology negative hematology ROS (+)   Anesthesia Other Findings Past Medical History:   Hypertension                                                 Elevated lipids                                              Depression                                                   Hypothyroidism                                              BMI    Body Mass Index   23.92 kg/m 2      Reproductive/Obstetrics negative OB ROS                             Anesthesia Physical  Anesthesia Plan  ASA: III  Anesthesia Plan: Spinal   Post-op Pain Management:    Induction:   PONV Risk Score and Plan:   Airway Management Planned: Nasal Cannula  Additional Equipment:   Intra-op Plan:   Post-operative Plan:   Informed Consent: I have reviewed the patients History and Physical, chart, labs and discussed the procedure including the risks, benefits and alternatives for the proposed  anesthesia with the patient or authorized representative who has indicated his/her understanding and acceptance.   Dental Advisory Given  Plan Discussed with: Anesthesiologist, CRNA and Surgeon  Anesthesia Plan Comments:         Anesthesia Quick Evaluation

## 2018-09-17 NOTE — Progress Notes (Signed)
Spoke with Dr Ernest PineHooten about sulfa allergy and celebrex, per Dr Ernest PineHooten ok to give Celebrex.

## 2018-09-18 MED ORDER — OXYCODONE HCL 5 MG PO TABS: 5.0000 mg | ORAL_TABLET | ORAL | 0 refills | Status: AC | PRN

## 2018-09-18 MED ORDER — ENOXAPARIN SODIUM 30 MG/0.3ML ~~LOC~~ SOLN: 30.0000 mg | Freq: Two times a day (BID) | SUBCUTANEOUS | 0 refills | Status: AC

## 2018-09-18 MED ORDER — TRAMADOL HCL 50 MG PO TABS: 50.0000 mg | ORAL_TABLET | ORAL | 0 refills | Status: AC | PRN

## 2018-09-18 NOTE — Progress Notes (Signed)
   Subjective: 1 Day Post-Op Procedure(s) (LRB): COMPUTER ASSISTED TOTAL KNEE ARTHROPLASTY (Left) Patient reports pain as 3 on 0-10 scale.   Patient is well, and has had no acute complaints or problems Therapy was started yesterday with bed to chair transfer Plan is to go Rehab after hospital stay. no nausea and no vomiting Patient denies any chest pains or shortness of breath. Objective: Vital signs in last 24 hours: Temp:  [97.4 F (36.3 C)-97.9 F (36.6 C)] 97.7 F (36.5 C) (11/14 0354) Pulse Rate:  [65-79] 65 (11/14 0354) Resp:  [16-19] 19 (11/14 0354) BP: (119-160)/(55-73) 139/64 (11/14 0354) SpO2:  [95 %-99 %] 95 % (11/14 0354) Heels are non tender and elevated off the bed using rolled towels as well as bone foam under operative heel Intake/Output from previous day: 11/13 0701 - 11/14 0700 In: 2303.5 [P.O.:120; I.V.:958.9; IV Piggyback:1224.6] Out: 2915 [Urine:2675; Drains:190; Blood:50] Intake/Output this shift: No intake/output data recorded.  Recent Labs    09/17/18 0648  HGB 14.3   Recent Labs    09/17/18 0648  HCT 42.0   Recent Labs    09/17/18 0648  NA 140  K 3.3*  GLUCOSE 110*   No results for input(s): LABPT, INR in the last 72 hours.  EXAM General - Patient is Alert, Appropriate and Oriented Extremity - Neurologically intact Neurovascular intact Sensation intact distally Intact pulses distally Dorsiflexion/Plantar flexion intact Compartment soft Dressing - dressing C/D/I Motor Function - intact, moving foot and toes well on exam.  Able to do straight leg raise on her own  Past Medical History:  Diagnosis Date  . Arthritis   . Depression   . Elevated lipids   . Glaucoma    left eye  . Hypertension   . Hypothyroidism   . PONV (postoperative nausea and vomiting)     Assessment/Plan: 1 Day Post-Op Procedure(s) (LRB): COMPUTER ASSISTED TOTAL KNEE ARTHROPLASTY (Left) Active Problems:   Total knee replacement status  Estimated body  mass index is 23.94 kg/m as calculated from the following:   Height as of this encounter: 5' 2.5" (1.588 m).   Weight as of this encounter: 60.3 kg. Advance diet Up with therapy D/C IV fluids Plan for discharge tomorrow Discharge to SNF  Labs: None DVT Prophylaxis - Lovenox, Foot Pumps and TED hose Weight-Bearing as tolerated to left leg D/C O2 and Pulse OX and try on Room Air Begin working on bowel movement  Hien Cunliffe R. [facility] PA [facility] 09/18/2018, 7:37 AM

## 2018-09-18 NOTE — Evaluation (Signed)
Occupational Therapy Evaluation Patient Details Name: Sheri IronMary W Barker MRN: [id number] DOB: 1929-10-13 Today's Date: 09/18/2018    History of Present Illness Pt is a 84 y/o F s/p L TKA.    Clinical Impression   Pt seen for OT evaluation this date, POD#1 from above surgery. Pt lives by herself with her small dog and was independent in all ADLs prior to surgery, however occasionally using AD for longer distances. Pt is eager to return to PLOF with less pain and improved safety and independence. Pt currently requires minimal assist for LB dressing/bathing while in seated position due to pain and limited AROM of L knee. Pt ambulated with RW to bathroom and performed toilet [facility] w/ CGA and VC for hand/foot/RW placement. Pt instructed in polar care mgt, falls prevention strategies, home/routines modifications, DME/AE for LB bathing and dressing tasks, and compression stocking mgt. Pt verbalized understanding, would benefit from skilled OT services including additional instruction in dressing techniques with or without assistive devices for dressing and bathing skills to support recall and carryover prior to discharge and ultimately to maximize safety, independence, and minimize falls risk and caregiver burden. Currently recommend STR prior to return home.    Follow Up Recommendations  SNF    Equipment Recommendations      Recommendations for Other Services       Precautions / Restrictions Precautions Precautions: Fall;Knee Required Braces or Orthoses: Knee Immobilizer - Left Knee Immobilizer - Left: Other (comment)(("if pt unable to perform SLR")) Restrictions Weight Bearing Restrictions: Yes LLE Weight Bearing: Weight bearing as tolerated      Mobility Bed Mobility Overal bed mobility: Needs Assistance Bed Mobility: Supine to Sit     Supine to sit: Supervision;HOB elevated     General bed mobility comments: +time/effort, discomfort  Transfers Overall transfer  level: Needs assistance Equipment used: Rolling walker (2 wheeled)(youth RW) Transfers: Sit to/from Stand Sit to Stand: Min guard         General transfer comment: CGA, VC for hand/foot placement, slight lightheadedness which resolved quickly    Balance Overall balance assessment: Needs assistance Sitting-balance support: Feet supported;No upper extremity supported Sitting balance-Leahy Scale: Good     Standing balance support: Bilateral upper extremity supported;During functional activity Standing balance-Leahy Scale: Fair                           ADL either performed or assessed with clinical judgement   ADL Overall ADL's : Needs assistance/impaired Eating/Feeding: Sitting;Independent   Grooming: Standing;Min guard;Wash/dry hands   Upper Body Bathing: Sitting;Supervision/ safety   Lower Body Bathing: Sit to/from stand;Minimal assistance   Upper Body Dressing : Sitting;Supervision/safety   Lower Body Dressing: Sit to/from stand;Minimal assistance   Toilet Transfer: RW;Ambulation;BSC;Regular Toilet;Min Pension scheme managerguard Toilet Transfer Details (indicate cue type and reason): VC for hand/RW placement, BSC over toilet Toileting- Clothing Manipulation and Hygiene: Sitting/lateral lean;Modified independent       Functional mobility during ADLs: Min guard;Rolling walker       Vision Patient Visual Report: No change from baseline       Perception     Praxis      Pertinent Vitals/Pain Pain Assessment: 0-10 Pain Score: 4  Faces Pain Scale: Hurts little more Pain Location: L knee Pain Descriptors / Indicators: Discomfort Pain Intervention(s): Limited activity within patient's tolerance;Monitored during session;Repositioned;Ice applied     Hand Dominance     Extremity/Trunk Assessment Upper Extremity Assessment Upper Extremity Assessment: Overall [facility]  for tasks assessed   Lower Extremity Assessment Lower Extremity Assessment: Defer to PT evaluation;LLE  deficits/detail   Cervical / Trunk Assessment Cervical / Trunk Assessment: Normal   Communication Communication Communication: No difficulties   Cognition Arousal/Alertness: Awake/alert Behavior During Therapy: WFL for tasks assessed/performed Overall Cognitive Status: Within Functional Limits for tasks assessed                                     General Comments       Exercises Other Exercises Other Exercises: pt instructed in AE/DME to maximize safety/indep with ADL Other Exercises: pt instructed in polar care mgt and compression stocking mgt Other Exercises: pt educated in falls prevention strategies   Shoulder Instructions      Home Living Family/patient expects to be discharged to:: Skilled nursing facility Living Arrangements: Alone Available Help at Discharge: Family;Available PRN/intermittently Type of Home: House Home Access: Stairs to enter [employer] of Steps: 2 Entrance Stairs-Rails: Left Home Layout: Two level;Able to live on main level with bedroom/bathroom     Bathroom Shower/Tub: Walk-in shower   Bathroom Toilet: Handicapped height     Home Equipment: Environmental consultant - 2 wheels;Cane - quad;Bedside commode;Grab bars - tub/shower;Shower seat - built in   Additional Comments: Daughter able to provide 24/7 assist until 11/15 but then pt will only have intermittent assist from son.        Prior Functioning/Environment Level of Independence: Independent        Comments: Ambulating up to 1 mile without AD.  No falls in the past 6 months.  Ind with ADLs.         OT Problem List: Decreased strength;Decreased knowledge of use of DME or AE;Decreased range of motion;Decreased activity tolerance;Impaired balance (sitting and/or standing);Pain      OT Treatment/Interventions: Self-care/ADL training;Balance training;Therapeutic exercise;Therapeutic activities;DME and/or AE instruction;Patient/family education    OT Goals(Current goals  can be found in the care plan section) Acute Rehab OT Goals Patient Stated Goal: rehab before home OT Goal Formulation: With patient Time For Goal Achievement: 10/02/18 Potential to Achieve Goals: Good ADL Goals Pt Will Perform Lower Body Dressing: with modified independence;sit to/from stand(AE as needed, LRAD for amb/standing) Pt Will Transfer to Toilet: with supervision;ambulating(BSC over toilet, LRAD for amb) Additional ADL Goal #1: Pt will independently instruct caregiver in compression stocking mgt, including donning/doffing, wear schedule, and positioning. Additional ADL Goal #2: Pt will independently instruct caregiver in polar care mgt, including donning/doffing, wear schedule, and positioning.  OT Frequency: Min 1X/week   Barriers to D/C: Decreased caregiver support          Co-evaluation              AM-PAC PT "6 Clicks" Daily Activity     Outcome Measure Help from another person eating meals?: None Help from another person taking care of personal grooming?: A Little Help from another person toileting, which includes using toliet, bedpan, or urinal?: A Little Help from another person bathing (including washing, rinsing, drying)?: A Little Help from another person to put on and taking off regular upper body clothing?: None Help from another person to put on and taking off regular lower body clothing?: A Little 6 Click Score: 20   End of Session Equipment Utilized During Treatment: Gait belt;Rolling walker  Activity Tolerance: Patient tolerated treatment well Patient left: in chair;with call bell/phone within reach;with chair alarm set;with SCD's reapplied;Other (comment)(polar care  in place)  OT Visit Diagnosis: Other abnormalities of gait and mobility (R26.89);Muscle weakness (generalized) (M62.81);Pain Pain - Right/Left: Left Pain - part of body: Knee                Time: 0830-0903 OT Time Calculation (min): 33 min Charges:  OT General Charges $OT Visit: 1  Visit OT Evaluation $OT Eval Low Complexity: 1 Low OT Treatments $Self Care/Home Management : 23-37 mins  Richrd Prime, MPH, MS, OTR/L ascom [phone] 09/18/18, 10:14 AM

## 2018-09-18 NOTE — Clinical Social Work Note (Signed)
Clinical Social Work Assessment  Patient Details  Name: Sheri Barker MRN: [id number] Date of Birth: 14-May-1929  Date of referral:  09/18/18               Reason for consult:  Facility Placement                Permission sought to share information with:  Chartered certified accountant granted to share information::  Yes, Verbal Permission Granted  Name::      Sheri Barker::   [place]   Relationship::     Contact Information:     Housing/Transportation Living arrangements for the past 2 months:  [place] of Information:  Patient Patient Interpreter Needed:  None Criminal Activity/Legal Involvement Pertinent to Current Situation/Hospitalization:  No - Comment as needed Significant Relationships:  Adult Children Lives with:  Self Do you feel safe going back to the place where you live?  Yes Need for family participation in patient care:  Yes (Comment)  Care giving concerns:  Patient lives alone in [place].    Social Worker assessment / plan:  Holiday representative (CSW) received SNF consult. PT is recommending SNF. CSW met with patient alone at bedside to discuss D/C plan. Patient was alert and oriented X4 and was laying in the bed. CSW introduced self and explained role of CSW department. Per patient she lives alone and has 2 adult daughters and 1 adult son. CSW explained SNF process and that [facility] will have to approve it. Patient is agreeable to SNF search in [facility] FL2 complete and faxed out.   CSW presented bed offers to patient and she chose [facility]. Per Seth Bake admissions coordinator at [facility] she will start [facility] SNF authorization today. CSW will continue to follow and assist as needed.   Employment status:  Retired Nurse, adult PT Recommendations:  [place] / Referral to community resources:  [place]  Patient/Family's Response to  care:  Patient is agreeable to D/C to [facility].   Patient/Family's Understanding of and Emotional Response to Diagnosis, Current Treatment, and Prognosis:  Patient was very pleasant and thanked CSW for assistance.   Emotional Assessment Appearance:  Appears stated age Attitude/Demeanor/Rapport:    Affect (typically observed):  Accepting, Adaptable, Pleasant Orientation:  Oriented to Self, Oriented to Place, Oriented to  Time, Oriented to Situation Alcohol / Substance use:  Not Applicable Psych involvement (Current and /or in the community):  No (Comment)  Discharge Needs  Concerns to be addressed:  Discharge Planning Concerns Readmission within the last 30 days:  No Current discharge risk:  Dependent with Mobility Barriers to Discharge:  Continued Medical Work up   [employer], Veronia Beets, LCSW 09/18/2018, 4:28 PM

## 2018-09-18 NOTE — [place] FL2 (Signed)
[place] MEDICAID FL2 LEVEL OF CARE SCREENING TOOL     IDENTIFICATION  Patient Name: Sheri Barker Birthdate: Jul 12, 1929 Sex: female Admission Date (Current Location): 09/17/2018  [place]   Facility and Address:  [facility], [address], [place], [place] [address]      Provider Number: [id number]  Attending Physician Name and Address:  Donato HeinzHooten, James P, MD  Relative Name and Phone Number:       Current Level of Care: Hospital Recommended Level of Care: Skilled Nursing Facility Prior Approval Number:    Date Approved/Denied:   PASRR Number: [id number])  Discharge Plan: SNF    Current Diagnoses: Patient Active Problem List   Diagnosis Date Noted  . Total knee replacement status 09/17/2018  . Primary osteoarthritis of left knee 06/22/2018  . Status post total right knee replacement 08/17/2015  . Cervical radiculitis 02/28/2015  . DDD (degenerative disc disease), cervical 02/28/2015  . Anxiety state 05/14/2014  . Atrophic vaginitis 05/14/2014  . Benign essential hypertension 05/14/2014  . Other and unspecified hyperlipidemia 05/14/2014  . Symptomatic menopausal or female climacteric states 05/14/2014    Orientation RESPIRATION BLADDER Height & Weight     Self, Time, Situation, Place  Normal Continent Weight: 133 lb (60.3 kg) Height:  5' 2.5" (158.8 cm)  BEHAVIORAL SYMPTOMS/MOOD NEUROLOGICAL BOWEL NUTRITION STATUS      Continent Diet(Diet: Regular )  AMBULATORY STATUS COMMUNICATION OF NEEDS Skin   Extensive Assist Verbally Surgical wounds(Incision: Left Knee. )                       Personal Care Assistance Level of Assistance  Bathing, Feeding, Dressing Bathing Assistance: Limited assistance Feeding assistance: Independent Dressing Assistance: Limited assistance     Functional Limitations Info  Sight, Hearing, Speech Sight Info: Adequate Hearing Info: Impaired Speech Info: Adequate     SPECIAL CARE FACTORS FREQUENCY  PT (By licensed PT), OT (By licensed OT)     PT Frequency: (5) OT Frequency: (5)            Contractures      Additional Factors Info  Code Status, Allergies Code Status Info: (Full Code. ) Allergies Info: (Metronidazole, Phenazopyridine, Sulfa Antibiotics, Sulfamethoxazole-trimethoprim, Erythromycin, Nabumetone, Penicillin G)           Current Medications (09/18/2018):  This is the current hospital active medication list Current Facility-Administered Medications  Medication Dose Route Frequency Provider Last Rate Last Dose  . 0.9 %  sodium chloride infusion   Intravenous Continuous Hooten, Illene LabradorJames P, MD 100 mL/hr at 09/18/18 0340    . acetaminophen (OFIRMEV) IV 1,000 mg  1,000 mg Intravenous Q6H Hooten, Illene LabradorJames P, MD 400 mL/hr at 09/18/18 0529 1,000 mg at 09/18/18 0529  . acetaminophen (TYLENOL) tablet 325-650 mg  325-650 mg Oral Q6H PRN Hooten, Illene LabradorJames P, MD      . alum & mag hydroxide-simeth (MAALOX/MYLANTA) 200-200-20 MG/5ML suspension 30 mL  30 mL Oral Q4H PRN Hooten, Illene LabradorJames P, MD      . benazepril (LOTENSIN) tablet 20 mg  20 mg Oral Daily Frutoso ChaseSlaughter, Myra G, RPH   20 mg at 09/17/18 1334   And  . amLODipine (NORVASC) tablet 10 mg  10 mg Oral Daily Frutoso ChaseSlaughter, Myra G, RPH   10 mg at 09/17/18 1333  . bisacodyl (DULCOLAX) suppository 10 mg  10 mg Rectal Daily PRN Hooten, Illene LabradorJames P, MD      . celecoxib (CELEBREX) capsule 200 mg  200 mg Oral BID Donato Heinz, MD   200 mg at 09/17/18 2144  . diphenhydrAMINE (BENADRYL) 12.5 MG/5ML elixir 12.5-25 mg  12.5-25 mg Oral Q4H PRN Hooten, Illene Labrador, MD      . DULoxetine (CYMBALTA) DR capsule 60 mg  60 mg Oral Daily Hooten, Illene Labrador, MD      . enoxaparin (LOVENOX) injection 30 mg  30 mg Subcutaneous Q12H Hooten, Illene Labrador, MD   30 mg at 09/18/18 0848  . ferrous sulfate tablet 325 mg  325 mg Oral BID WC Donato Heinz, MD   325 mg at 09/18/18 0829  . gabapentin (NEURONTIN) capsule 300 mg  300 mg Oral QHS Hooten,  Illene Labrador, MD   300 mg at 09/17/18 2143  . hydrochlorothiazide (HYDRODIURIL) tablet 12.5 mg  12.5 mg Oral Daily Hooten, Illene Labrador, MD   12.5 mg at 09/17/18 1333  . HYDROmorphone (DILAUDID) injection 0.5-1 mg  0.5-1 mg Intravenous Q4H PRN Hooten, Illene Labrador, MD      . latanoprost (XALATAN) 0.005 % ophthalmic solution 1 drop  1 drop Both Eyes QHS Hooten, Illene Labrador, MD   1 drop at 09/17/18 2143  . levothyroxine (SYNTHROID, LEVOTHROID) tablet 75 mcg  75 mcg Oral QAC breakfast Donato Heinz, MD   75 mcg at 09/18/18 0527  . magnesium hydroxide (MILK OF MAGNESIA) suspension 30 mL  30 mL Oral Daily Hooten, Illene Labrador, MD   30 mL at 09/17/18 1332  . menthol-cetylpyridinium (CEPACOL) lozenge 3 mg  1 lozenge Oral PRN Hooten, Illene Labrador, MD       Or  . phenol (CHLORASEPTIC) mouth spray 1 spray  1 spray Mouth/Throat PRN Hooten, Illene Labrador, MD      . metoCLOPramide (REGLAN) tablet 5-10 mg  5-10 mg Oral Q8H PRN Hooten, Illene Labrador, MD       Or  . metoCLOPramide (REGLAN) injection 5-10 mg  5-10 mg Intravenous Q8H PRN Hooten, Illene Labrador, MD      . metoCLOPramide (REGLAN) tablet 10 mg  10 mg Oral TID AC & HS Hooten, Illene Labrador, MD   10 mg at 09/18/18 0829  . multivitamin with minerals tablet 1 tablet  1 tablet Oral Daily Hooten, Illene Labrador, MD      . ondansetron (ZOFRAN) tablet 4 mg  4 mg Oral Q6H PRN Hooten, Illene Labrador, MD       Or  . ondansetron (ZOFRAN) injection 4 mg  4 mg Intravenous Q6H PRN Hooten, Illene Labrador, MD      . oxyCODONE (Oxy IR/ROXICODONE) immediate release tablet 10 mg  10 mg Oral Q4H PRN Hooten, Illene Labrador, MD      . oxyCODONE (Oxy IR/ROXICODONE) immediate release tablet 5 mg  5 mg Oral Q4H PRN Hooten, Illene Labrador, MD   5 mg at 09/17/18 1334  . pantoprazole (PROTONIX) EC tablet 40 mg  40 mg Oral BID Donato Heinz, MD   40 mg at 09/17/18 2144  . senna-docusate (Senokot-S) tablet 1 tablet  1 tablet Oral BID Donato Heinz, MD   1 tablet at 09/17/18 2143  . sodium phosphate (FLEET) 7-19 GM/118ML enema 1 enema  1 enema Rectal Once PRN  Hooten, Illene Labrador, MD      . traMADol Janean Sark) tablet 50-100 mg  50-100 mg Oral Q4H PRN Donato Heinz, MD   100 mg at 09/18/18 0454     Discharge Medications: Please see discharge summary for a list of discharge medications.  Relevant Imaging Results:  Relevant Lab Results:   Additional Information (SSN: [id number])  Tanee Henery, Darleen Crocker, LCSW

## 2018-09-18 NOTE — Progress Notes (Signed)
Physical Therapy Treatment Patient Details Name: Sheri Barker MRN: [id number] DOB: 07/21/1929 Today's Date: 09/18/2018    History of Present Illness Pt is a 84 y/o F s/p L TKA.     PT Comments    [name] reported mild "woozy" feeling, thus vitals monitored throughout session (see general comments below).  Pt continues to require min guard assist for transfers and ambulation.  SNF remains most appropriate d/c plan at this time.     Follow Up Recommendations  SNF     Equipment Recommendations  None recommended by PT    Recommendations for Other Services       Precautions / Restrictions Precautions Precautions: Fall;Knee Required Braces or Orthoses: Knee Immobilizer - Left Knee Immobilizer - Left: Other (comment)("if pt unable to perform SLR") Restrictions Weight Bearing Restrictions: Yes LLE Weight Bearing: Weight bearing as tolerated    Mobility  Bed Mobility Overal bed mobility: Needs Assistance Bed Mobility: Sit to Supine       Sit to supine: Min guard   General bed mobility comments: Min guard for safety and pt requires increased time and effort  Transfers Overall transfer level: Needs assistance Equipment used: Rolling walker (2 wheeled)(youth RW) Transfers: Sit to/from Stand Sit to Stand: Min guard         General transfer comment: Min guard for safety as pt reports very slight dizziness initially.  Pt with proper hand placement and safe technique.   Ambulation/Gait Ambulation/Gait assistance: Min guard Gait Distance (Feet): 80 Feet Assistive device: Rolling walker (2 wheeled)(youth RW) Gait Pattern/deviations: Step-through pattern;Decreased stance time - left;Decreased weight shift to left Gait velocity: decreased   General Gait Details: Pt with good upright posture and performs step through gait pattern without cues.  Pt remains steady using youth RW.    Stairs             Wheelchair Mobility    Modified Rankin (Stroke Patients  Only)       Balance Overall balance assessment: Needs assistance Sitting-balance support: Feet supported;No upper extremity supported Sitting balance-Leahy Scale: Good     Standing balance support: Bilateral upper extremity supported;During functional activity Standing balance-Leahy Scale: Fair Standing balance comment: Pt able to stand statically without UE support but relies on UE support to ambulate.                            Cognition Arousal/Alertness: Awake/alert Behavior During Therapy: WFL for tasks assessed/performed Overall Cognitive Status: Within Functional Limits for tasks assessed                                        Exercises Total Joint Exercises Ankle Circles/Pumps: AROM;Both;10 reps;Supine Quad Sets: Strengthening;Both;10 reps;Supine Knee Flexion: AAROM;Left;Seated;Other (comment);5 reps(with 5 second holds)    General Comments General comments (skin integrity, edema, etc.): Vitals monitored throughout session as pt reports slight "woozy" feeling.  Vitals at rest sitting on BSC (pt on BSC upon PT arrival): BP 135/122, pulse 67, SpO2 93%.  Sitting EOB 127/65, pulse 67, SpO2 94%.  Sitting EOB after ~5 minutes: 128/63, pulse 67, SpO2 94%.  Standing at bedside: 112/60, pulse 69, SpO2 94%.  In supine at rest at end of session: 148/57, pulse 66, SpO2 92%.        Pertinent Vitals/Pain Pain Assessment: Faces Faces Pain Scale: Hurts a little bit Pain Location: L  knee Pain Descriptors / Indicators: Discomfort Pain Intervention(s): Limited activity within patient's tolerance;Monitored during session;Ice applied    Home Living                      Prior Function            PT Goals (current goals can now be found in the care plan section) Acute Rehab PT Goals Patient Stated Goal: rehab before home PT Goal Formulation: With patient Time For Goal Achievement: 10/01/18 Potential to Achieve Goals: Good Progress towards PT  goals: Progressing toward goals    Frequency    BID      PT Plan Current plan remains appropriate    Co-evaluation              AM-PAC PT "6 Clicks" Daily Activity  Outcome Measure  Difficulty turning over in bed (including adjusting bedclothes, sheets and blankets)?: A Little Difficulty moving from lying on back to sitting on the side of the bed? : Unable Difficulty sitting down on and standing up from a chair with arms (e.g., wheelchair, bedside commode, etc,.)?: A Lot Help needed moving to and from a bed to chair (including a wheelchair)?: A Little Help needed walking in hospital room?: A Little Help needed climbing 3-5 steps with a railing? : A Little 6 Click Score: 15    End of Session Equipment Utilized During Treatment: Gait belt Activity Tolerance: Patient tolerated treatment well Patient left: with call bell/phone within reach;with SCD's reapplied;in bed;with bed alarm set(with polar care and bone foam in place) Nurse Communication: Mobility status;Other (comment)(BP readings) PT Visit Diagnosis: Pain;Muscle weakness (generalized) (M62.81);Unsteadiness on feet (R26.81);Other abnormalities of gait and mobility (R26.89) Pain - Right/Left: Left Pain - part of body: Knee     Time: 1441-1520 PT Time Calculation (min) (ACUTE ONLY): 39 min  Charges:  $Gait Training: 8-22 mins $Therapeutic Exercise: 8-22 mins                     Encarnacion ChuAshley Isyss Espinal PT, DPT 09/18/2018, 5:12 PM

## 2018-09-18 NOTE — Discharge Summary (Signed)
Physician Discharge Summary  Patient ID: Sheri Barker MRN: [id number] DOB/AGE: 1929-02-05 84 y.o.  Admit date: 09/17/2018 Discharge date: 09/19/2018  Admission Diagnoses:  PRIMARY OSTEOARTHRITIS OF LEFT KNEE   Discharge Diagnoses: Patient Active Problem List   Diagnosis Date Noted  . Total knee replacement status 09/17/2018  . Primary osteoarthritis of left knee 06/22/2018  . Status post total right knee replacement 08/17/2015  . Cervical radiculitis 02/28/2015  . DDD (degenerative disc disease), cervical 02/28/2015  . Anxiety state 05/14/2014  . Atrophic vaginitis 05/14/2014  . Benign essential hypertension 05/14/2014  . Other and unspecified hyperlipidemia 05/14/2014  . Symptomatic menopausal or female climacteric states 05/14/2014    Past Medical History:  Diagnosis Date  . Arthritis   . Depression   . Elevated lipids   . Glaucoma    left eye  . Hypertension   . Hypothyroidism   . PONV (postoperative nausea and vomiting)      Transfusion: No transfusions during this admission   Consultants (if any):   Discharged Condition: Improved  Hospital Course: Sheri Barker is an 84 y.o. female who was admitted 09/17/2018 with a diagnosis of degenerative arthrosis left knee and went to the operating room on 09/17/2018 and underwent the above named procedures.    Surgeries:Procedure(s): COMPUTER ASSISTED TOTAL KNEE ARTHROPLASTY on 09/17/2018  PRE-OPERATIVE DIAGNOSIS: Degenerative arthrosis of the left knee, primary  POST-OPERATIVE DIAGNOSIS:  Same  PROCEDURE:  Left total knee arthroplasty using computer-assisted navigation  SURGEON:  Jena Gauss. M.D.  ASSISTANT:  Van Clines, PA (present and scrubbed throughout the case, critical for assistance with exposure, retraction, instrumentation, and closure)  ANESTHESIA: spinal  ESTIMATED BLOOD LOSS: 50 mL  FLUIDS REPLACED: 600 mL of crystalloid  TOURNIQUET TIME: 73 minutes  DRAINS: 2 medium Hemovac  drains  SOFT TISSUE RELEASES: Anterior cruciate ligament, posterior cruciate ligament, deep medial collateral ligament, patellofemoral ligament  IMPLANTS UTILIZED: DePuy PFC Sigma size 2.5 posterior stabilized femoral component (cemented), size 2 MBT tibial component (cemented), 35 mm 3 peg oval dome patella (cemented), and a 10 mm stabilized rotating platform polyethylene insert.  INDICATIONS FOR SURGERY: Sheri Barker is a 84 y.o. year old female with a long history of progressive knee pain. X-rays demonstrated severe degenerative changes in tricompartmental fashion. The patient had not seen any significant improvement despite conservative nonsurgical intervention. After discussion of the risks and benefits of surgical intervention, the patient expressed understanding of the risks benefits and agree with plans for total knee arthroplasty.   The risks, benefits, and alternatives were discussed at length including but not limited to the risks of infection, bleeding, nerve injury, stiffness, blood clots, the need for revision surgery, cardiopulmonary complications, among others, and they were willing to proceed. Patient tolerated the surgery well. No complications .Patient was taken to PACU where she was stabilized and then transferred to the orthopedic floor.  Patient started on Lovenox 30 mg q 12 hrs. Foot pumps applied bilaterally at 80 mm hgb. Heels elevated off bed with rolled towels. No evidence of DVT. Calves non tender. Negative Homan. Physical therapy started on day #1 for gait training and transfer with OT starting on  day #1 for ADL and assisted devices. Patient has done well with therapy. Ambulated 80 feet upon being discharged.  Patient's IV And Foley were discontinued on day #1 with Hemovac being discontinued on day #2. Dressing was changed on day 2 prior to patient being discharged   She was given perioperative antibiotics:  Anti-infectives (From  admission, onward)   Start      Dose/Rate Route Frequency Ordered Stop   09/17/18 1400  clindamycin (CLEOCIN) IVPB 600 mg     600 mg 100 mL/hr over 30 Minutes Intravenous Every 6 hours 09/17/18 1221 09/18/18 0902   09/17/18 0626  clindamycin (CLEOCIN) 900 MG/50ML IVPB    Note to Pharmacy:  [name], [name]   : cabinet override      09/17/18 0626 09/17/18 0752   09/17/18 0600  clindamycin (CLEOCIN) IVPB 900 mg  Status:  Discontinued     900 mg 100 mL/hr over 30 Minutes Intravenous On call to O.R. 09/16/18 2143 09/17/18 0612    .  She was fitted with AV 1 compression foot pump devices, instructed on heel pumps, early ambulation, and fitted with TED stockings bilaterally for DVT prophylaxis.  She benefited maximally from the hospital stay and there were no complications.    Recent vital signs:  Vitals:   09/18/18 2349 09/19/18 0757  BP: (!) 132/52 (!) 131/52  Pulse: 63 68  Resp: 19   Temp: 97.8 F (36.6 C) 98 F (36.7 C)  SpO2: 93% 92%    Recent laboratory studies:  Lab Results  Component Value Date   HGB 14.3 09/17/2018   HGB 14.4 09/03/2018   HGB 11.0 (L) 08/20/2015   Lab Results  Component Value Date   WBC 8.5 09/03/2018   PLT 349 09/03/2018   Lab Results  Component Value Date   INR 0.90 09/03/2018   Lab Results  Component Value Date   NA 140 09/17/2018   K 3.3 (L) 09/17/2018   CL 99 09/03/2018   CO2 29 09/03/2018   BUN 18 09/03/2018   CREATININE 0.67 09/03/2018   GLUCOSE 110 (H) 09/17/2018    Discharge Medications:   Allergies as of 09/19/2018      Reactions   Metronidazole Swelling, Other (See Comments)   Swelling in lips    Phenazopyridine Swelling, Other (See Comments)   Swelling of lips   Sulfa Antibiotics Swelling   Sulfamethoxazole-trimethoprim Swelling, Other (See Comments)   Swelling of lips   Erythromycin Hives, Itching, Rash   Nabumetone Other (See Comments)   GI upset.   Penicillin G Rash, Other (See Comments)   Has patient had a PCN reaction causing immediate rash,  facial/tongue/throat swelling, SOB or lightheadedness with hypotension: No Has patient had a PCN reaction causing severe rash involving mucus membranes or skin necrosis: No Has patient had a PCN reaction that required hospitalization: No Has patient had a PCN reaction occurring within the last 10 years: No If all of the above answers are "NO", then may proceed with Cephalosporin use.      Medication List    STOP taking these medications   aspirin EC 81 MG tablet     TAKE these medications   acetaminophen 500 MG tablet Commonly known as:  TYLENOL Take 500 mg by mouth every 6 (six) hours as needed for moderate pain or headache.   amLODipine-benazepril 10-20 MG capsule Commonly known as:  LOTREL Take 1 capsule by mouth daily.   CALTRATE 600+D PO Take 1 tablet by mouth daily.   CENTRUM SILVER ADULT 50+ PO Take 1 tablet by mouth daily.   DULoxetine 60 MG capsule Commonly known as:  CYMBALTA Take 60 mg by mouth daily.   enoxaparin 30 MG/0.3ML injection Commonly known as:  LOVENOX Inject 0.3 mLs (30 mg total) into the skin every 12 (twelve) hours.   hydrochlorothiazide 12.5 MG tablet  Commonly known as:  HYDRODIURIL Take 12.5 mg by mouth daily.   latanoprost 0.005 % ophthalmic solution Commonly known as:  XALATAN Place 1 drop into both eyes at bedtime.   levothyroxine 75 MCG tablet Commonly known as:  SYNTHROID, LEVOTHROID Take 75 mcg by mouth daily before breakfast.   magnesium hydroxide 400 MG/5ML suspension Commonly known as:  MILK OF MAGNESIA Take 60 mLs by mouth daily as needed for mild constipation.   meloxicam 15 MG tablet Commonly known as:  MOBIC Take 15 mg by mouth daily as needed for pain.   oxyCODONE 5 MG immediate release tablet Commonly known as:  Oxy IR/ROXICODONE Take 1 tablet (5 mg total) by mouth every 4 (four) hours as needed for moderate pain (pain score 4-6).   polyethylene glycol packet Commonly known as:  MIRALAX / GLYCOLAX Take 17 g by  mouth daily as needed for moderate constipation.   Red Yeast Rice 600 MG Caps Take 1,200 mg by mouth daily.   traMADol 50 MG tablet Commonly known as:  ULTRAM Take 1-2 tablets (50-100 mg total) by mouth every 4 (four) hours as needed for moderate pain.            Durable Medical Equipment  (From admission, onward)         Start     Ordered   09/17/18 1221  DME Walker rolling  Once    Question:  Patient needs a walker to treat with the following condition  Answer:  Total knee replacement status   09/17/18 1221   09/17/18 1221  DME Bedside commode  Once    Question:  Patient needs a bedside commode to treat with the following condition  Answer:  Total knee replacement status   09/17/18 1221          Diagnostic Studies: Dg Knee Left Port  Result Date: 09/17/2018 CLINICAL DATA:  Followup knee arthroplasty EXAM: PORTABLE LEFT KNEE - 1-2 VIEW COMPARISON:  None. FINDINGS: Two-view show total knee arthroplasty. Components appear well positioned. Drain in the suprapatellar region. No radiographically detectable complication. IMPRESSION: Good appearance following total knee arthroplasty. Electronically Signed   By: Paulina Fusi M.D.   On: 09/17/2018 11:07    Disposition:   Discharge Instructions    Increase activity slowly   Complete by:  As directed        Contact information for follow-up providers    Evon Slack, PA-C On 10/01/2018.   Specialties:  Orthopedic Surgery, Emergency Medicine Why:  at 2:15pm Contact information: [address] [place] [address] [phone]        Donato Heinz, MD On 10/30/2018.   Specialty:  Orthopedic Surgery Why:  at 1:30pm Contact information: [address] [facility] [place] [address] [phone]            Contact information for after-discharge care    Destination    [facility] .   Service:  Skilled Nursing Contact information: [address] [place] [address] [phone]                   Signed: Lenard Forth, Sheala Dosh 09/19/2018, 8:27 AM

## 2018-09-18 NOTE — Care Management (Signed)
PT is recommending SNF; CSW following. RNCM will follow along.  

## 2018-09-18 NOTE — Progress Notes (Signed)
Physical Therapy Treatment Patient Details Name: Sheri Barker MRN: [id number] DOB: 13-Oct-1929 Today's Date: 09/18/2018    History of Present Illness Pt is a 84 y/o F s/p L TKA.     PT Comments    Sheri Barker made good progress toward therapy goals but does require min guard assist for transfers and ambulation.  Pt ambulated 100 ft with RW, remaining steady and demonstrating step through gait pattern.  Pt SNF remains most appropriate d/c plan at this time.    Follow Up Recommendations  SNF     Equipment Recommendations  None recommended by PT    Recommendations for Other Services       Precautions / Restrictions Precautions Precautions: Fall;Knee Precaution Comments: Reviewed no pillow under knee Required Braces or Orthoses: Knee Immobilizer - Left Knee Immobilizer - Left: Other (comment)("if pt unable to perform SLR") Restrictions Weight Bearing Restrictions: Yes LLE Weight Bearing: Weight bearing as tolerated    Mobility  Bed Mobility               General bed mobility comments: Pt sitting in chair at start and end of session  Transfers Overall transfer level: Needs assistance Equipment used: Rolling walker (2 wheeled)(youth RW) Transfers: Sit to/from Stand Sit to Stand: Min guard         General transfer comment: Min guard for safety as pt reports very slight dizziness initially.  Pt with proper hand placement and safe technique.   Ambulation/Gait Ambulation/Gait assistance: Min guard Gait Distance (Feet): 100 Feet Assistive device: Rolling walker (2 wheeled)(youth) Gait Pattern/deviations: Step-through pattern;Decreased stance time - left;Decreased weight shift to left Gait velocity: decreased   General Gait Details: Pt with good upright posture and performs step through gait pattern without cues.  Pt remains steady using youth RW.    Stairs             Wheelchair Mobility    Modified Rankin (Stroke Patients Only)       Balance  Overall balance assessment: Needs assistance Sitting-balance support: Feet supported;No upper extremity supported Sitting balance-Leahy Scale: Good     Standing balance support: Bilateral upper extremity supported;During functional activity Standing balance-Leahy Scale: Fair Standing balance comment: Pt able to stand statically without UE support but relies on UE support to ambulate.                            Cognition Arousal/Alertness: Awake/alert Behavior During Therapy: WFL for tasks assessed/performed Overall Cognitive Status: Within Functional Limits for tasks assessed                                        Exercises Total Joint Exercises Ankle Circles/Pumps: AROM;Both;10 reps;Seated Quad Sets: Strengthening;Both;10 reps;Seated Knee Flexion: AAROM;Left;Seated;Other (comment);5 reps(with 5 second holds) Goniometric ROM: 0-94 deg    General Comments        Pertinent Vitals/Pain Pain Assessment: Faces Faces Pain Scale: Hurts little more Pain Location: L knee Pain Descriptors / Indicators: Discomfort Pain Intervention(s): Limited activity within patient's tolerance;Monitored during session;Repositioned;Premedicated before session;Utilized relaxation techniques;Ice applied    Home Living                      Prior Function            PT Goals (current goals can now be found in the care plan section)  Acute Rehab PT Goals Patient Stated Goal: rehab before home PT Goal Formulation: With patient Time For Goal Achievement: 10/01/18 Potential to Achieve Goals: Good Progress towards PT goals: Progressing toward goals    Frequency    BID      PT Plan Current plan remains appropriate    Co-evaluation              AM-PAC PT "6 Clicks" Daily Activity  Outcome Measure  Difficulty turning over in bed (including adjusting bedclothes, sheets and blankets)?: A Little Difficulty moving from lying on back to sitting on the  side of the bed? : Unable Difficulty sitting down on and standing up from a chair with arms (e.g., wheelchair, bedside commode, etc,.)?: A Lot Help needed moving to and from a bed to chair (including a wheelchair)?: A Little Help needed walking in hospital room?: A Little Help needed climbing 3-5 steps with a railing? : A Little 6 Click Score: 15    End of Session Equipment Utilized During Treatment: Gait belt Activity Tolerance: Patient tolerated treatment well Patient left: in chair;with call bell/phone within reach;with chair alarm set;with SCD's reapplied(with polar care and bone foam in place) Nurse Communication: Mobility status PT Visit Diagnosis: Pain;Muscle weakness (generalized) (M62.81);Unsteadiness on feet (R26.81);Other abnormalities of gait and mobility (R26.89) Pain - Right/Left: Left Pain - part of body: Knee     Time: 0910-0936 PT Time Calculation (min) (ACUTE ONLY): 26 min  Charges:  $Gait Training: 8-22 mins $Therapeutic Exercise: 8-22 mins                     Session was performed by student PT, Sheri RenshawShane Barker, and directed, overseen, and documented by this PT.  Sheri ChuAshley Tahni Barker PT, DPT 09/18/2018, 9:58 AM

## 2018-09-18 NOTE — Clinical Social Work Placement (Signed)
   CLINICAL SOCIAL WORK PLACEMENT  NOTE  Date:  09/18/2018  Patient Details  Name: Sheri Barker MRN: [id number] Date of Birth: [birth year]  Clinical Social Work is seeking post-discharge placement for this patient at the Skilled  Nursing Facility level of care (*CSW will initial, date and re-position this form in  chart as items are completed):  Yes   Patient/family provided with [facility] Clinical Social Work Department's list of facilities offering this level of care within the geographic area requested by the patient (or if unable, by the patient's family).  Yes   Patient/family informed of their freedom to choose among providers that offer the needed level of care, that participate in Medicare, Medicaid or managed care program needed by the patient, have an available bed and are willing to accept the patient.  Yes   Patient/family informed of [facility]'s ownership interest in [facility] and [facility], as well as of the fact that they are under no obligation to receive care at these facilities.  PASRR submitted to EDS on       PASRR number received on       Existing PASRR number confirmed on 09/18/18     FL2 transmitted to all facilities in geographic area requested by pt/family on 09/18/18     FL2 transmitted to all facilities within larger geographic area on       Patient informed that his/her managed care company has contracts with or will negotiate with certain facilities, including the following:        Yes   Patient/family informed of bed offers received.  Patient chooses bed at [facility] )     Physician recommends and patient chooses bed at      Patient to be transferred to   on  .  Patient to be transferred to facility by       Patient family notified on   of transfer.  Name of family member notified:        PHYSICIAN       Additional Comment:    _______________________________________________ Ameera Tigue, Darleen CrockerBailey M, LCSW 09/18/2018, 4:27  PM

## 2018-09-19 NOTE — Progress Notes (Signed)
[facility]

## 2018-09-19 NOTE — Progress Notes (Signed)
   Subjective: 2 Days Post-Op Procedure(s) (LRB): COMPUTER ASSISTED TOTAL KNEE ARTHROPLASTY (Left) Patient reports pain as mild.   Patient is well, and has had no acute complaints or problems Plan is to go Rehab after hospital stay. no nausea and no vomiting Patient denies any chest pains or shortness of breath. Objective: Vital signs in last 24 hours: Temp:  [97.5 F (36.4 C)-98 F (36.7 C)] 98 F (36.7 C) (11/15 0757) Pulse Rate:  [63-68] 68 (11/15 0757) Resp:  [19] 19 (11/14 2349) BP: (124-132)/(50-52) 131/52 (11/15 0757) SpO2:  [92 %-93 %] 92 % (11/15 0757) Heels are non tender and elevated off the bed using rolled towels as well as bone foam under operative heel Intake/Output from previous day: 11/14 0701 - 11/15 0700 In: 260 [P.O.:260] Out: 50 [Drains:50] Intake/Output this shift: No intake/output data recorded.  Recent Labs    09/17/18 0648  HGB 14.3   Recent Labs    09/17/18 0648  HCT 42.0   Recent Labs    09/17/18 0648  NA 140  K 3.3*  GLUCOSE 110*   No results for input(s): LABPT, INR in the last 72 hours.  EXAM General - Patient is Alert, Appropriate and Oriented Extremity - Neurologically intact Neurovascular intact Sensation intact distally Intact pulses distally Dorsiflexion/Plantar flexion intact Compartment soft Dressing - dressing C/D/I and scant drainage. Hemovac removed Motor Function - intact, moving foot and toes well on exam.  Able to do straight leg raise on her own  Past Medical History:  Diagnosis Date  . Arthritis   . Depression   . Elevated lipids   . Glaucoma    left eye  . Hypertension   . Hypothyroidism   . PONV (postoperative nausea and vomiting)     Assessment/Plan: 2 Days Post-Op Procedure(s) (LRB): COMPUTER ASSISTED TOTAL KNEE ARTHROPLASTY (Left) Active Problems:   Total knee replacement status  Estimated body mass index is 23.94 kg/m as calculated from the following:   Height as of this encounter: 5' 2.5"  (1.588 m).   Weight as of this encounter: 60.3 kg. Advance diet Up with therapy  Pain well controlled Vital signs stable CM to assist with discharge to SNF   Labs: None DVT Prophylaxis - Lovenox, Foot Pumps and TED hose Weight-Bearing as tolerated to left leg D/C O2 and Pulse OX and try on Room Air   Evon Slackhomas C.  PA-C [facility] 09/19/2018, 9:06 AM

## 2018-09-19 NOTE — Anesthesia Postprocedure Evaluation (Signed)
Anesthesia Post Note  Patient: Garry HeaterMary W Gedney  Procedure(s) Performed: COMPUTER ASSISTED TOTAL KNEE ARTHROPLASTY (Left Knee)  Patient location during evaluation: Nursing Unit Anesthesia Type: Spinal Level of consciousness: oriented and awake and alert Pain management: pain level controlled Vital Signs Assessment: post-procedure vital signs reviewed and stable Respiratory status: spontaneous breathing and respiratory function stable Cardiovascular status: blood pressure returned to baseline and stable Postop Assessment: no headache, no backache, patient able to bend at knees, able to ambulate and no apparent nausea or vomiting Anesthetic complications: no     Last Vitals:  Vitals:   09/18/18 1607 09/18/18 2349  BP: (!) 124/50 (!) 132/52  Pulse: 68 63  Resp:  19  Temp: (!) 36.4 C 36.6 C  SpO2: 93% 93%    Last Pain:  Vitals:   09/19/18 0740  TempSrc:   PainSc: 0-No pain    LLE Motor Response: Purposeful movement (09/19/18 0742) LLE Sensation: Full sensation (09/19/18 0742)          Karoline Caldwelleana Nakina Spatz

## 2018-09-19 NOTE — Progress Notes (Signed)
Patient is medically stable for D/C [facility] today. Per Sue LushAndrea admissions coordinator at [facility] received and patient can come today to room 222. RN will call report at [phone] and arrange EMS for transport. Clinical Child psychotherapistocial Worker (CSW) sent D[facility] orders [facility] via [employer]. Patient is aware of above. Patient's daughter [name] is at bedside and aware of above. Please reconsult if future social work needs arise. CSW signing off.   Baker Hughes IncorporatedBailey Jerard Bays, LCSW [phone]

## 2018-09-19 NOTE — Progress Notes (Signed)
Physical Therapy Treatment Patient Details Name: Sheri Barker MRN: [id number] DOB: 10/13/1929 Today's Date: 09/19/2018    History of Present Illness Pt is a 84 y/o F s/p L TKA.     PT Comments    Ms. Tine put forth good effort again today and is making good progress.  Pt ambulated 85 the min guard assist and performed sit<>stand with min guard assist.  SNF remains most appropriate d/c plan at this time.   Follow Up Recommendations  SNF     Equipment Recommendations  None recommended by PT    Recommendations for Other Services       Precautions / Restrictions Precautions Precautions: Fall;Knee Required Braces or Orthoses: Knee Immobilizer - Left Knee Immobilizer - Left: Other (comment)("if pt unable to perform SLR") Restrictions Weight Bearing Restrictions: Yes LLE Weight Bearing: Weight bearing as tolerated    Mobility  Bed Mobility Overal bed mobility: Needs Assistance Bed Mobility: Supine to Sit     Supine to sit: Supervision;HOB elevated     General bed mobility comments: Supervision for safety. Pt requires increased time and effort.   Transfers Overall transfer level: Needs assistance Equipment used: Rolling walker (2 wheeled)(youth RW) Transfers: Sit to/from Stand Sit to Stand: Min guard         General transfer comment: Min guard for safety.  Pt with proper hand placement and safe technique.   Ambulation/Gait Ambulation/Gait assistance: Min guard Gait Distance (Feet): 85 Feet Assistive device: Rolling walker (2 wheeled)(youth RW) Gait Pattern/deviations: Step-through pattern;Decreased stance time - left;Decreased weight shift to left Gait velocity: decreased   General Gait Details: Pt with good upright posture and performs step through gait pattern without cues.  Pt remains steady using youth RW.    Stairs             Wheelchair Mobility    Modified Rankin (Stroke Patients Only)       Balance Overall balance assessment: Needs  assistance Sitting-balance support: Feet supported;No upper extremity supported Sitting balance-Leahy Scale: Good     Standing balance support: Bilateral upper extremity supported;During functional activity Standing balance-Leahy Scale: Fair Standing balance comment: Pt able to stand statically without UE support but relies on UE support to ambulate.                            Cognition Arousal/Alertness: Awake/alert Behavior During Therapy: WFL for tasks assessed/performed Overall Cognitive Status: Within Functional Limits for tasks assessed                                        Exercises Total Joint Exercises Ankle Circles/Pumps: AROM;Both;10 reps;Supine Quad Sets: Strengthening;Both;10 reps;Supine Knee Flexion: Left;Seated;10 reps;AROM    General Comments General comments (skin integrity, edema, etc.): Daughter present during session.       Pertinent Vitals/Pain Pain Assessment: Faces Faces Pain Scale: Hurts a little bit Pain Location: L knee Pain Descriptors / Indicators: Discomfort Pain Intervention(s): Limited activity within patient's tolerance;Monitored during session;Repositioned;Ice applied    Home Living                      Prior Function            PT Goals (current goals can now be found in the care plan section) Acute Rehab PT Goals Patient Stated Goal: rehab before home PT Goal Formulation:  With patient Time For Goal Achievement: 10/01/18 Potential to Achieve Goals: Good Progress towards PT goals: Progressing toward goals    Frequency    BID      PT Plan Current plan remains appropriate    Co-evaluation              AM-PAC PT "6 Clicks" Daily Activity  Outcome Measure  Difficulty turning over in bed (including adjusting bedclothes, sheets and blankets)?: A Little Difficulty moving from lying on back to sitting on the side of the bed? : A Little Difficulty sitting down on and standing up from a  chair with arms (e.g., wheelchair, bedside commode, etc,.)?: A Lot Help needed moving to and from a bed to chair (including a wheelchair)?: A Little Help needed walking in hospital room?: A Little Help needed climbing 3-5 steps with a railing? : A Little 6 Click Score: 17    End of Session Equipment Utilized During Treatment: Gait belt Activity Tolerance: Patient tolerated treatment well Patient left: with call bell/phone within reach;with SCD's reapplied;in chair;with chair alarm set;with family/visitor present(with polar care and bone foam in place) Nurse Communication: Mobility status PT Visit Diagnosis: Pain;Muscle weakness (generalized) (M62.81);Unsteadiness on feet (R26.81);Other abnormalities of gait and mobility (R26.89) Pain - Right/Left: Left Pain - part of body: Knee     Time: [id number] PT Time Calculation (min) (ACUTE ONLY): 30 min  Charges:  $Gait Training: 8-22 mins $Therapeutic Exercise: 8-22 mins                      Session was performed by student PT, Lisbeth RenshawShane Courtney, and directed, overseen, and documented by this PT.  Encarnacion ChuAshley Abashian PT, DPT 09/19/2018, 12:38 PM

## 2018-09-19 NOTE — Progress Notes (Signed)
Patient was [facility]. IV removed with cath intact. Report already called by Leavy CellaJasmine, RN. Packet sent with patient.

## 2018-09-19 NOTE — Clinical Social Work Placement (Signed)
   CLINICAL SOCIAL WORK PLACEMENT  NOTE  Date:  09/19/2018  Patient Details  Name: Sheri Barker MRN: [id number] Date of Birth: 09-10-1929  Clinical Social Work is seeking post-discharge placement for this patient at the Skilled  Nursing Facility level of care (*CSW will initial, date and re-position this form in  chart as items are completed):  Yes   Patient/family provided with [place] Clinical Social Work Department's list of facilities offering this level of care within the geographic area requested by the patient (or if unable, by the patient's family).  Yes   Patient/family informed of their freedom to choose among providers that offer the needed level of care, that participate in Medicare, Medicaid or managed care program needed by the patient, have an available bed and are willing to accept the patient.  Yes   Patient/family informed of [facility]'s ownership interest in [facility] and [facility], as well as of the fact that they are under no obligation to receive care at these facilities.  PASRR submitted to EDS on       PASRR number received on       Existing PASRR number confirmed on 09/18/18     FL2 transmitted to all facilities in geographic area requested by pt/family on 09/18/18     FL2 transmitted to all facilities within larger geographic area on       Patient informed that his/her managed care company has contracts with or will negotiate with certain facilities, including the following:        Yes   Patient/family informed of bed offers received.  Patient [facility] bed [facility]     Physician recommends and patient chooses bed at      Patient to be transferred [facility] on 09/19/18.  Patient to be transferred to facility by [facility] )     Patient family notified on 09/19/18 of transfer.  Name of family member notified:  (Patient's daughter [name] is aware of D/C today. )     PHYSICIAN       Additional  Comment:    _______________________________________________ Shaneika Rossa, Darleen CrockerBailey M, LCSW 09/19/2018, 2:06 PM

## 2018-10-06 ENCOUNTER — Other Ambulatory Visit: Payer: Self-pay

## 2018-10-06 ENCOUNTER — Ambulatory Visit: Payer: Medicare Other | Attending: Orthopedic Surgery | Admitting: Physical Therapy

## 2018-10-06 DIAGNOSIS — M6281 Muscle weakness (generalized): Secondary | ICD-10-CM | POA: Diagnosis present

## 2018-10-06 DIAGNOSIS — M25662 Stiffness of left knee, not elsewhere classified: Secondary | ICD-10-CM | POA: Diagnosis present

## 2018-10-06 DIAGNOSIS — R262 Difficulty in walking, not elsewhere classified: Secondary | ICD-10-CM | POA: Diagnosis present

## 2018-10-06 DIAGNOSIS — M25562 Pain in left knee: Secondary | ICD-10-CM | POA: Diagnosis present

## 2018-10-06 NOTE — Therapy (Signed)
[facility] PHYSICAL AND SPORTS MEDICINE [address] [place], [place], [address] Phone: [phone]   Fax:  [phone]  Physical Therapy Evaluation  Patient Details  Name: Sheri IronMary W Barker MRN: [id number] Date of Birth: 1929-07-10 Referring Provider (PT): Hooten   Encounter Date: 10/06/2018  PT End of Session - 10/06/18 1245    Visit Number  1    Number of Visits  17    Date for PT Re-Evaluation  12/01/18    Authorization Type  1/10 (PN start 10/06/2018)    PT Start Time  1100    PT Stop Time  1150    PT Time Calculation (min)  50 min    Activity Tolerance  Patient tolerated treatment well;Patient limited by pain    Behavior During Therapy  [facility] for tasks assessed/performed       Past Medical History:  Diagnosis Date  . Arthritis   . Depression   . Elevated lipids   . Glaucoma    left eye  . Hypertension   . Hypothyroidism   . PONV (postoperative nausea and vomiting)     Past Surgical History:  Procedure Laterality Date  . ABDOMINAL HYSTERECTOMY    . CATARACT EXTRACTION W/ INTRAOCULAR LENS IMPLANT Bilateral   . JOINT REPLACEMENT    . KNEE ARTHROPLASTY Right 08/17/2015   Procedure: COMPUTER ASSISTED TOTAL KNEE ARTHROPLASTY;  Surgeon: Donato HeinzJames P Hooten, MD;  Location: [facility] ORS;  Service: Orthopedics;  Laterality: Right;  . KNEE ARTHROPLASTY Left 09/17/2018   Procedure: COMPUTER ASSISTED TOTAL KNEE ARTHROPLASTY;  Surgeon: Donato HeinzHooten, James P, MD;  Location: [facility] ORS;  Service: Orthopedics;  Laterality: Left;  . KNEE ARTHROSCOPY Right   . OSTEOTOMY TOES    . TONSILLECTOMY      There were no vitals filed for this visit.   Subjective Assessment - 10/06/18 1112    Subjective  L TKA is taking longer and patient doesn't feel as confident in the LLE as she did with the R TKA. Patient is completely independent with ADLs and lives alone with all living spaces on the first floor. Patient is not currently driving., but is looking forward to  returning to that activity.    Pertinent History  s/p L TKA 09/17/2018 and [facility]. After d/c from SNF, patient had HH PT for 5 visits (discharged 10/03/2018). Stitches removed 10/01/2018. Patient had R TKA 08/2015. PMH: osteoporosis, DDD, OA, glaucoma, HTN, hyperlipidemia.    Limitations  Lifting;Standing;Walking;House hold activities    How long can you sit comfortably?  unlimited    How long can you stand comfortably?  15 min     How long can you walk comfortably?  > 10 min household distances with [facility]    Patient Stated Goals  drive, walk dog, exercise program, church and community activities    Currently in Pain?  Yes    Pain Score  4     Pain Location  Knee    Pain Orientation  Left;Posterior;Lateral    Pain Descriptors / Indicators  Tightness;Aching    Pain Type  Surgical pain    Pain Radiating Towards  L ankle    Pain Onset  1 to 4 weeks ago    Pain Frequency  Intermittent    Aggravating Factors   walking, bending the knee    Pain Relieving Factors  tramadol, extra strength tylenol, ice, knee extension    Effect of Pain on Daily Activities  unable to participate  in leisure and community activities    Multiple Pain Sites  No       OBJECTIVE  MUSCULOSKELETAL: Tremor: Absent Bulk: Normal Tone: Normal, no spasticity, rigidity, or clonus No trophic changes noted to lower extremities. Appropriate ecchymosis, erythema, and edema noted around knee; concordant with post-surgical status. No gross knee deformity noted  Posture No gross abnormalities noted in standing or seated posture  Hip AROM: WFL and painless with overpressure in all planes  Gait  No gross deficits in gait identified. SPC 2 point gait pattern. No unsteadiness noted. Mildly decreased stance time on LLE.  Palpation Mild tenderness to palpation along lateral joint line of knee. No pain over patellar tendon.  Strength R/L 5/4 Hip flexion 5/5 Hip abduction (sitting) 5/5 Hip  adduction (sitting) 5/4 Knee extension 5/4* Knee flexion  5/5 Ankle Dorsiflexion  5/5 Ankle Plantarflexion (sitting) *indicates pain  AROM  Knee R/L Flexion: 135 degrees /107 degrees Extension: -4 degrees / lacking 5 degrees   Passive Accessory Motion L Patella: stiffness concordant with post-surgical status. No pain.   NEUROLOGICAL:  Mental Status Patient is oriented to person, place and time.  Recent memory is intact.  Remote memory is intact.  Attention span and concentration are intact.  Expressive speech is intact.  Patient's fund of knowledge is within normal limits for educational level.  Sensation Grossly intact to light touch bilateral LEs as determined by testing dermatomes L2-S2 Proprioception and hot/cold testing deferred on this date   VASCULAR Dorsalis pedis and posterior tibial pulses are palpable   FUNCTIONAL TESTS  Five Time Sit to stand: 20.0 seconds, no UE use.      [facility] PT Assessment - 10/07/18 0001      Assessment   Medical Diagnosis  s/p L TKA    Referring Provider (PT)  Hooten    Onset Date/Surgical Date  09/17/18    Hand Dominance  Right    Next MD Visit  11/05/2018    Prior Therapy  Yes; SNF and HH       Precautions   Precautions  Fall      Balance Screen   Has the patient fallen in the past 6 months  No    Has the patient had a decrease in activity level because of a fear of falling?   Yes    Is the patient reluctant to leave their home because of a fear of falling?   Yes      Home Environment   Living Environment  Private residence    Living Arrangements  Alone    Type of Home  House    Home Access  Stairs to enter    Entrance Stairs-Number of Steps  3    Entrance Stairs-Rails  Right;Left    Home Layout  Two level   all living spaces are on the first floor     Prior Function   Level of Independence  Independent       Objective measurements completed on examination: See above findings.     PT Education -  10/06/18 1244    Education Details  POC, activity modification, prognosis, log roll technique for bed mobility to decrease back pain    Person(s) Educated  Patient    Methods  Explanation;Demonstration    Comprehension  Verbalized understanding;Returned demonstration;Need further instruction         Plan - 10/06/18 1246    Clinical Impression Statement  Patient is a pleasant 84 year old female presenting to clinic  s/p L TKA (sx date 09/17/2018). Patient presents with deficits in L knee ROM, LLE strength, gait, activity tolerance, balance, and endurance commensurate with her post-surgical timeline. Patient has 107 degrees of active L knee flexion and is lacking 4 degrees of active knee extension. Patient has some noted lag when performing a L SLR in supine. The incision site is clean and bandaged appropriately, but there is some warmth noted throughout the knee joint. The patient was educated on monitoring the LLE for signs of infection and was told to contact her MD if she noted any significant changes. Patient articulated understanding. Patient is currently amulating with a SPC with a 2 point gait pattern and no significant unsteadiness noted. Patient will benefit from skilled therapeutic intervention to address deficits in ROM, strength, gait, and balance in order to return to PLOF, improve overall QOL, and maintain current level of independence.     History and Personal Factors relevant to plan of care:  (+) compliant with HEP, past PT success, strong social support (-) lives alone, comorbidities: osteoporosis, OA, DDD, HTN, anxiety    Clinical Presentation  Stable    Clinical Decision Making  Low    Rehab Potential  Good    PT Frequency  2x / week    PT Duration  8 weeks    PT Treatment/Interventions  Aquatic Therapy;Canalith Repostioning;Cryotherapy;Electrical Stimulation;Iontophoresis 4mg /ml Dexamethasone;Moist Heat;Ultrasound;Gait training;Stair training;Functional mobility  training;Therapeutic activities;Therapeutic exercise;Balance training;Neuromuscular re-education;Patient/family education;Manual techniques;Scar mobilization;Passive range of motion;Dry needling;Energy conservation;Joint Manipulations;Taping    PT Next Visit Plan  review HH HEP and progress ROM and strengthening    PT Home Exercise Plan  continue with current HH HEP    Consulted and Agree with Plan of Care  Patient       Patient will benefit from skilled therapeutic intervention in order to improve the following deficits and impairments:  Abnormal gait, Improper body mechanics, Pain, Decreased scar mobility, Decreased mobility, Decreased endurance, Decreased activity tolerance, Decreased range of motion, Decreased strength, Hypomobility, Impaired flexibility, Difficulty walking, Decreased balance  Visit Diagnosis: Left knee pain, unspecified chronicity  Muscle weakness (generalized)  Difficulty in walking, not elsewhere classified  Decreased range of motion (ROM) of left knee     Problem List Patient Active Problem List   Diagnosis Date Noted  . Total knee replacement status 09/17/2018  . Primary osteoarthritis of left knee 06/22/2018  . Status post total right knee replacement 08/17/2015  . Cervical radiculitis 02/28/2015  . DDD (degenerative disc disease), cervical 02/28/2015  . Anxiety state 05/14/2014  . Atrophic vaginitis 05/14/2014  . Benign essential hypertension 05/14/2014  . Other and unspecified hyperlipidemia 05/14/2014  . Symptomatic menopausal or female climacteric states 05/14/2014   Sheria Lang PT, DPT [phone] 10/07/2018, 12:23 PM  [facility] PHYSICAL AND SPORTS MEDICINE [address] [place], [address] Phone: [phone]   Fax:  [phone]  Name: Sheri Barker MRN: [id number] Date of Birth: Mar 05, 1929

## 2018-10-08 ENCOUNTER — Ambulatory Visit: Payer: Medicare Other

## 2018-10-08 DIAGNOSIS — M25562 Pain in left knee: Secondary | ICD-10-CM

## 2018-10-08 DIAGNOSIS — M6281 Muscle weakness (generalized): Secondary | ICD-10-CM

## 2018-10-08 DIAGNOSIS — R262 Difficulty in walking, not elsewhere classified: Secondary | ICD-10-CM

## 2018-10-08 NOTE — Therapy (Signed)
[place] [facility]ICINE [address], [place], [address] Phone: [phone]   Fax:  [phone]  Physical Therapy Treatment  Patient Details  Name: Sheri Barker MRN: [id number] Date of Birth: 1929/07/05 Referring Provider (PT): Hooten   Encounter Date: 10/08/2018  PT End of Session - 10/08/18 1443    Visit Number  2    Number of Visits  17    Date for PT Re-Evaluation  12/01/18    Authorization Type  2/10 (PN start 10/06/2018)    PT Start Time  1305    PT Stop Time  1345    PT Time Calculation (min)  40 min    Activity Tolerance  Patient tolerated treatment well;Patient limited by pain    Behavior During Therapy  [facility] for tasks assessed/performed       Past Medical History:  Diagnosis Date  . Arthritis   . Depression   . Elevated lipids   . Glaucoma    left eye  . Hypertension   . Hypothyroidism   . PONV (postoperative nausea and vomiting)     Past Surgical History:  Procedure Laterality Date  . ABDOMINAL HYSTERECTOMY    . CATARACT EXTRACTION W/ INTRAOCULAR LENS IMPLANT Bilateral   . JOINT REPLACEMENT    . KNEE ARTHROPLASTY Right 08/17/2015   Procedure: COMPUTER ASSISTED TOTAL KNEE ARTHROPLASTY;  Surgeon: Donato Heinz, MD;  Location: [facility] ORS;  Service: Orthopedics;  Laterality: Right;  . KNEE ARTHROPLASTY Left 09/17/2018   Procedure: COMPUTER ASSISTED TOTAL KNEE ARTHROPLASTY;  Surgeon: Donato Heinz, MD;  Location: [facility] ORS;  Service: Orthopedics;  Laterality: Left;  . KNEE ARTHROSCOPY Right   . OSTEOTOMY TOES    . TONSILLECTOMY      There were no vitals filed for this visit.  Subjective Assessment - 10/08/18 1327    Subjective  Patient reports no major changes since the previous session. Patient reports she took a tramadol before today'v visit.     Pertinent History  s/p L TKA 09/17/2018 and was discharged to [facility] 09/24/2018. After d/c from SNF, patient had HH PT for 5 visits  (discharged 10/03/2018). Stitches removed 10/01/2018. Patient had R TKA 08/2015. PMH: osteoporosis, DDD, OA, glaucoma, HTN, hyperlipidemia.    Limitations  Lifting;Standing;Walking;House hold activities    How long can you sit comfortably?  unlimited    How long can you stand comfortably?  15 min     How long can you walk comfortably?  > 10 min household distances with [facility]    Patient Stated Goals  drive, walk dog, exercise program, church and community activities    Currently in Pain?  Yes    Pain Score  3     Pain Location  Knee    Pain Orientation  Left    Pain Descriptors / Indicators  Aching    Pain Type  Surgical pain    Pain Onset  1 to 4 weeks ago    Pain Frequency  Intermittent        TREATMENT Therapeutic Exercise: Sit to stands -- 2 x 10  Ball roll outs in sitting -- x3 min Tandem widened with forward/posterior weight shifts -- x 30sec with L side backwards Acsending/desending stairs -- x 10 with varying level of UE support and gait patterns Hip abduction in standing  -- x10 Single leg stance for time -- 3 x 10sec Standing butt kicks -- x 10   Manual therapy: STM performed  to the quadriceps and hamstring musculature to decrease increased pain and spasms with patient in sitting. Patellar mobs sup/inf and laterally x30sec in each direction.   Patient demonstrates no increase in pain at the end of the session  PT Education - 10/08/18 1335    Education provided  Yes    Education Details  HIp abduction, Step ups 1 flight, sit to stand    Person(s) Educated  Patient    Methods  Explanation;Demonstration    Comprehension  Verbalized understanding;Returned demonstration       PT Short Term Goals - 10/07/18 1237      PT SHORT TERM GOAL #1   Title  Pt will be independent with HEP in order to improve strength and balance in order to decrease fall risk and improve function at home and work.     Baseline  not initiated    Time  2    Period  Weeks    Status  New     Target Date  10/20/18        PT Long Term Goals - 10/07/18 1237      PT LONG TERM GOAL #1   Title  Patient will demosntrate improved function with left LE for walking and daily activties with FOTO score improved to 56/100    Baseline  FOTO: 48/100    Time  8    Period  Weeks    Status  New    Target Date  12/02/18      PT LONG TERM GOAL #2   Title  Patient will achieve 125 degrees of active knee flexion and 0 degrees of active knee extension on the LLE in order to improve overall function and mobility.    Baseline  L knee flexion: 107; L knee extension lacking 5 degrees    Time  8    Period  Weeks    Status  New    Target Date  12/02/18      PT LONG TERM GOAL #3   Title  Patient will perform a 5TSTS with no UE use and no loss of balance from a standard height chair in under 15 seconds to demonstrate increased LE strength and decrease risk of falls.    Baseline  20.0 sec, no UE support    Time  8    Period  Weeks    Status  New    Target Date  12/02/18            Plan - 10/08/18 1444    Clinical Impression Statement  Patient demosntrates decreased weight shifting onto the affected side which is improved with cueing. Patient demonstrates improvement with ability to perform stairs without UE support and ability to perform a step over step gait pattern. Patient will benefit from further skilled therapy to return to prior level of function.     Rehab Potential  Good    PT Frequency  2x / week    PT Duration  8 weeks    PT Treatment/Interventions  Aquatic Therapy;Canalith Repostioning;Cryotherapy;Electrical Stimulation;Iontophoresis 4mg /ml Dexamethasone;Moist Heat;Ultrasound;Gait training;Stair training;Functional mobility training;Therapeutic activities;Therapeutic exercise;Balance training;Neuromuscular re-education;Patient/family education;Manual techniques;Scar mobilization;Passive range of motion;Dry needling;Energy conservation;Joint Manipulations;Taping    PT Next Visit  Plan  review HH HEP and progress ROM and strengthening    PT Home Exercise Plan  continue with current HH HEP    Consulted and Agree with Plan of Care  Patient       Patient will benefit from skilled therapeutic intervention in order  to improve the following deficits and impairments:  Abnormal gait, Improper body mechanics, Pain, Decreased scar mobility, Decreased mobility, Decreased endurance, Decreased activity tolerance, Decreased range of motion, Decreased strength, Hypomobility, Impaired flexibility, Difficulty walking, Decreased balance  Visit Diagnosis: Muscle weakness (generalized)  Left knee pain, unspecified chronicity  Difficulty in walking, not elsewhere classified     Problem List Patient Active Problem List   Diagnosis Date Noted  . Total knee replacement status 09/17/2018  . Primary osteoarthritis of left knee 06/22/2018  . Status post total right knee replacement 08/17/2015  . Cervical radiculitis 02/28/2015  . DDD (degenerative disc disease), cervical 02/28/2015  . Anxiety state 05/14/2014  . Atrophic vaginitis 05/14/2014  . Benign essential hypertension 05/14/2014  . Other and unspecified hyperlipidemia 05/14/2014  . Symptomatic menopausal or female climacteric states 05/14/2014    Myrene Galas, PT DPT 10/08/2018, 2:46 PM  [place] [facility] PHYSICAL AND SPORTS MEDICINE [address], [place], [address] Phone: [phone]   Fax:  [phone]  Name: Sheri Barker MRN: [id number] Date of Birth: 1928-11-11

## 2018-10-13 ENCOUNTER — Ambulatory Visit: Payer: Medicare Other

## 2018-10-13 DIAGNOSIS — R262 Difficulty in walking, not elsewhere classified: Secondary | ICD-10-CM

## 2018-10-13 DIAGNOSIS — M25562 Pain in left knee: Secondary | ICD-10-CM | POA: Diagnosis not present

## 2018-10-13 DIAGNOSIS — M6281 Muscle weakness (generalized): Secondary | ICD-10-CM

## 2018-10-13 NOTE — Therapy (Signed)
[place] [facility] [address] [place], [place], [address] Phone: [phone]   Fax:  [phone]  Physical Therapy Treatment  Patient Details  Name: Sheri Barker MRN: [id number] Date of Birth: 09-19-1929 Referring Provider (PT): Hooten   Encounter Date: 10/13/2018  PT End of Session - 10/13/18 1518    Visit Number  3    Number of Visits  17    Date for PT Re-Evaluation  12/01/18    Authorization Type  3/10 (PN start 10/06/2018)    PT Start Time  1430    PT Stop Time  1515    PT Time Calculation (min)  45 min    Activity Tolerance  Patient tolerated treatment well;Patient limited by pain    Behavior During Therapy  [facility] for tasks assessed/performed       Past Medical History:  Diagnosis Date  . Arthritis   . Depression   . Elevated lipids   . Glaucoma    left eye  . Hypertension   . Hypothyroidism   . PONV (postoperative nausea and vomiting)     Past Surgical History:  Procedure Laterality Date  . ABDOMINAL HYSTERECTOMY    . CATARACT EXTRACTION W/ INTRAOCULAR LENS IMPLANT Bilateral   . JOINT REPLACEMENT    . KNEE ARTHROPLASTY Right 08/17/2015   Procedure: COMPUTER ASSISTED TOTAL KNEE ARTHROPLASTY;  Surgeon: Donato HeinzJames P Hooten, MD;  Location: [facility] ORS;  Service: Orthopedics;  Laterality: Right;  . KNEE ARTHROPLASTY Left 09/17/2018   Procedure: COMPUTER ASSISTED TOTAL KNEE ARTHROPLASTY;  Surgeon: Donato HeinzHooten, James P, MD;  Location: [facility] ORS;  Service: Orthopedics;  Laterality: Left;  . KNEE ARTHROSCOPY Right   . OSTEOTOMY TOES    . TONSILLECTOMY      There were no vitals filed for this visit.  Subjective Assessment - 10/13/18 1511    Subjective  Patient reports she has been doing well. Patient states she is worried about her progress with her L TKA.     Pertinent History  s/p L TKA 09/17/2018 and was discharged to [facility] 09/24/2018. After d/c from SNF, patient had HH PT for 5 visits (discharged  10/03/2018). Stitches removed 10/01/2018. Patient had R TKA 08/2015. PMH: osteoporosis, DDD, OA, glaucoma, HTN, hyperlipidemia.    Limitations  Lifting;Standing;Walking;House hold activities    How long can you sit comfortably?  unlimited    How long can you stand comfortably?  15 min     How long can you walk comfortably?  > 10 min [facility]    Patient Stated Goals  drive, walk dog, exercise program, church and community activities    Currently in Pain?  Yes    Pain Score  2     Pain Location  Knee    Pain Orientation  Left    Pain Descriptors / Indicators  Aching    Pain Type  Surgical pain    Pain Onset  1 to 4 weeks ago    Pain Frequency  Intermittent       TREATMENT Therapeutic Exercise: SLR in sitting - x 15  Quad sets in sitting - x 20 with Hamstring pulls with  RTB - x 15 on the L LE Sit to stands -- 2 x 10  Hip abduction in standing  -- x15 with RTB around knees Heel walking in standing - x 20 Acsending/desending stairs -- x 10 with varying level of UE support and gait patterns   Manual therapy:  STM performed to the quadriceps and hamstring musculature to decrease increased pain and spasms with patient in sitting. Patellar mobs sup/inf and laterally x30sec in each direction.   Patient demonstrates no increase in pain at the end of the session   PT Education - 10/13/18 1518    Education provided  Yes    Education Details  asending/decsending stairs at home    Person(s) Educated  Patient    Methods  Explanation;Demonstration    Comprehension  Verbalized understanding;Returned demonstration       PT Short Term Goals - 10/07/18 1237      PT SHORT TERM GOAL #1   Title  Pt will be independent with HEP in order to improve strength and balance in order to decrease fall risk and improve function at home and work.     Baseline  not initiated    Time  2    Period  Weeks    Status  New    Target Date  10/20/18        PT Long Term Goals - 10/07/18  1237      PT LONG TERM GOAL #1   Title  Patient will demosntrate improved function with left LE for walking and daily activties with FOTO score improved to 56/100    Baseline  FOTO: 48/100    Time  8    Period  Weeks    Status  New    Target Date  12/02/18      PT LONG TERM GOAL #2   Title  Patient will achieve 125 degrees of active knee flexion and 0 degrees of active knee extension on the LLE in order to improve overall function and mobility.    Baseline  L knee flexion: 107; L knee extension lacking 5 degrees    Time  8    Period  Weeks    Status  New    Target Date  12/02/18      PT LONG TERM GOAL #3   Title  Patient will perform a 5TSTS with no UE use and no loss of balance from a standard height chair in under 15 seconds to demonstrate increased LE strength and decrease risk of falls.    Baseline  20.0 sec, no UE support    Time  8    Period  Weeks    Status  New    Target Date  12/02/18            Plan - 10/13/18 1522    Clinical Impression Statement  Patient demonstrates improvement with knee extension today most notably with performing standing exercises such as performing heel walking. Although patient is improving, she continues to demonstrate increased difficulty with transferring up and down the stairs. Patient will benefit from further skilled therapy to return to prior level of function.     Rehab Potential  Good    PT Frequency  2x / week    PT Duration  8 weeks    PT Treatment/Interventions  Aquatic Therapy;Canalith Repostioning;Cryotherapy;Electrical Stimulation;Iontophoresis 4mg /ml Dexamethasone;Moist Heat;Ultrasound;Gait training;Stair training;Functional mobility training;Therapeutic activities;Therapeutic exercise;Balance training;Neuromuscular re-education;Patient/family education;Manual techniques;Scar mobilization;Passive range of motion;Dry needling;Energy conservation;Joint Manipulations;Taping    PT Next Visit Plan  review HH HEP and progress ROM and  strengthening    PT Home Exercise Plan  continue with current HH HEP    Consulted and Agree with Plan of Care  Patient       Patient will benefit from skilled therapeutic intervention in order to improve  the following deficits and impairments:  Abnormal gait, Improper body mechanics, Pain, Decreased scar mobility, Decreased mobility, Decreased endurance, Decreased activity tolerance, Decreased range of motion, Decreased strength, Hypomobility, Impaired flexibility, Difficulty walking, Decreased balance  Visit Diagnosis: Muscle weakness (generalized)  Left knee pain, unspecified chronicity  Difficulty in walking, not elsewhere classified     Problem List Patient Active Problem List   Diagnosis Date Noted  . Total knee replacement status 09/17/2018  . Primary osteoarthritis of left knee 06/22/2018  . Status post total right knee replacement 08/17/2015  . Cervical radiculitis 02/28/2015  . DDD (degenerative disc disease), cervical 02/28/2015  . Anxiety state 05/14/2014  . Atrophic vaginitis 05/14/2014  . Benign essential hypertension 05/14/2014  . Other and unspecified hyperlipidemia 05/14/2014  . Symptomatic menopausal or female climacteric states 05/14/2014    Myrene Galas, PT DPT 10/13/2018, 3:33 PM  [place] [facility] PHYSICAL AND SPORTS MEDICINE [address] [place], [address] Phone: [phone]   Fax:  [phone]  Name: Sheri Barker MRN: [id number] Date of Birth: Dec 04, 1928

## 2018-10-15 ENCOUNTER — Ambulatory Visit: Payer: Medicare Other

## 2018-10-15 DIAGNOSIS — M25562 Pain in left knee: Secondary | ICD-10-CM | POA: Diagnosis not present

## 2018-10-15 DIAGNOSIS — R262 Difficulty in walking, not elsewhere classified: Secondary | ICD-10-CM

## 2018-10-15 DIAGNOSIS — M6281 Muscle weakness (generalized): Secondary | ICD-10-CM

## 2018-10-15 NOTE — Therapy (Signed)
[place] [facility] PHYS[facility]ORTS MEDICINE [address], [place], [address] Phone: [phone]   Fax:  [phone]  Physical Therapy Treatment  Patient Details  Name: Sheri Barker MRN: [id number] Date of Birth: May 18, 1929 Referring Provider (PT): Hooten   Encounter Date: 10/15/2018  PT End of Session - 10/15/18 1334    Visit Number  4    Number of Visits  17    Date for PT Re-Evaluation  12/01/18    Authorization Type  4/10 (PN start 10/06/2018)    PT Start Time  1300    PT Stop Time  1343    PT Time Calculation (min)  43 min    Activity Tolerance  Patient tolerated treatment well;Patient limited by pain    Behavior During Therapy  [facility] for tasks assessed/performed       Past Medical History:  Diagnosis Date  . Arthritis   . Depression   . Elevated lipids   . Glaucoma    left eye  . Hypertension   . Hypothyroidism   . PONV (postoperative nausea and vomiting)     Past Surgical History:  Procedure Laterality Date  . ABDOMINAL HYSTERECTOMY    . CATARACT EXTRACTION W/ INTRAOCULAR LENS IMPLANT Bilateral   . JOINT REPLACEMENT    . KNEE ARTHROPLASTY Right 08/17/2015   Procedure: COMPUTER ASSISTED TOTAL KNEE ARTHROPLASTY;  Surgeon: Donato Heinz, MD;  Location: [facility] ORS;  Service: Orthopedics;  Laterality: Right;  . KNEE ARTHROPLASTY Left 09/17/2018   Procedure: COMPUTER ASSISTED TOTAL KNEE ARTHROPLASTY;  Surgeon: Donato Heinz, MD;  Location: [facility] ORS;  Service: Orthopedics;  Laterality: Left;  . KNEE ARTHROSCOPY Right   . OSTEOTOMY TOES    . TONSILLECTOMY      There were no vitals filed for this visit.  Subjective Assessment - 10/15/18 1326    Subjective  Patient reports improvement with going up and down the stairs but states she continues to have difficulty with performing those at home.     Pertinent History  s/p L TKA 09/17/2018 and was discharged to [facility] 09/24/2018. After d/c from SNF, patient had HH PT for  5 visits (discharged 10/03/2018). Stitches removed 10/01/2018. Patient had R TKA 08/2015. PMH: osteoporosis, DDD, OA, glaucoma, HTN, hyperlipidemia.    Limitations  Lifting;Standing;Walking;House hold activities    How long can you sit comfortably?  unlimited    How long can you stand comfortably?  15 min     How long can you walk comfortably?  > 10 min household distances with [facility]    Patient Stated Goals  drive, walk dog, exercise program, church and community activities    Currently in Pain?  Yes    Pain Score  3     Pain Location  Knee    Pain Orientation  Left    Pain Descriptors / Indicators  Aching    Pain Type  Surgical pain    Pain Onset  1 to 4 weeks ago    Pain Frequency  Intermittent         TREATMENT Therapeutic Exercise: SLR in sitting - x 15  Quad sets in sitting - x 20 with Sit to stands -- 2 x 10 with GTB around knees and holding 4# weight Heel walking in standing - x 20 Leg press at OMEGA - x 20 45# Acsending/desending stairs -- x 7 with varying level of UE support and gait patterns Step ups onto 8" step  --  2 x 10 with UE support  Side stepping up and over 8" step - x 10 with UE support   Manual therapy: STM performed to the quadriceps and hamstring musculature to decrease increased pain and spasms with patient in sitting. Patellar mobs sup/inf and laterally x30sec in each direction.    Patient demonstrates no increase in pain at the end of the session    PT Education - 10/15/18 1334    Education provided  Yes    Education Details  form/technique with exercises     Person(s) Educated  Patient    Methods  Explanation;Demonstration    Comprehension  Verbalized understanding;Returned demonstration       PT Short Term Goals - 10/07/18 1237      PT SHORT TERM GOAL #1   Title  Pt will be independent with HEP in order to improve strength and balance in order to decrease fall risk and improve function at home and work.     Baseline  not initiated    Time  2     Period  Weeks    Status  New    Target Date  10/20/18        PT Long Term Goals - 10/07/18 1237      PT LONG TERM GOAL #1   Title  Patient will demosntrate improved function with left LE for walking and daily activties with FOTO score improved to 56/100    Baseline  FOTO: 48/100    Time  8    Period  Weeks    Status  New    Target Date  12/02/18      PT LONG TERM GOAL #2   Title  Patient will achieve 125 degrees of active knee flexion and 0 degrees of active knee extension on the LLE in order to improve overall function and mobility.    Baseline  L knee flexion: 107; L knee extension lacking 5 degrees    Time  8    Period  Weeks    Status  New    Target Date  12/02/18      PT LONG TERM GOAL #3   Title  Patient will perform a 5TSTS with no UE use and no loss of balance from a standard height chair in under 15 seconds to demonstrate increased LE strength and decrease risk of falls.    Baseline  20.0 sec, no UE support    Time  8    Period  Weeks    Status  New    Target Date  12/02/18            Plan - 10/15/18 1342    Clinical Impression Statement  Patient demonstrates improvement with stepping abaility and performed greater standing based exercises to improve LE strength. Patient continues to require UE support for stepping motions and patient will benefit from further skilled therapy to return to prior level of function.     Rehab Potential  Good    PT Frequency  2x / week    PT Duration  8 weeks    PT Treatment/Interventions  Aquatic Therapy;Canalith Repostioning;Cryotherapy;Electrical Stimulation;Iontophoresis 4mg /ml Dexamethasone;Moist Heat;Ultrasound;Gait training;Stair training;Functional mobility training;Therapeutic activities;Therapeutic exercise;Balance training;Neuromuscular re-education;Patient/family education;Manual techniques;Scar mobilization;Passive range of motion;Dry needling;Energy conservation;Joint Manipulations;Taping    PT Next Visit Plan   review HH HEP and progress ROM and strengthening    PT Home Exercise Plan  continue with current HH HEP    Consulted and Agree with Plan of Care  Patient  Patient will benefit from skilled therapeutic intervention in order to improve the following deficits and impairments:  Abnormal gait, Improper body mechanics, Pain, Decreased scar mobility, Decreased mobility, Decreased endurance, Decreased activity tolerance, Decreased range of motion, Decreased strength, Hypomobility, Impaired flexibility, Difficulty walking, Decreased balance  Visit Diagnosis: Muscle weakness (generalized)  Left knee pain, unspecified chronicity  Difficulty in walking, not elsewhere classified     Problem List Patient Active Problem List   Diagnosis Date Noted  . Total knee replacement status 09/17/2018  . Primary osteoarthritis of left knee 06/22/2018  . Status post total right knee replacement 08/17/2015  . Cervical radiculitis 02/28/2015  . DDD (degenerative disc disease), cervical 02/28/2015  . Anxiety state 05/14/2014  . Atrophic vaginitis 05/14/2014  . Benign essential hypertension 05/14/2014  . Other and unspecified hyperlipidemia 05/14/2014  . Symptomatic menopausal or female climacteric states 05/14/2014    Myrene GalasWesley Rishaan Gunner, PT DPT 10/15/2018, 1:45 PM  [place] [facility]IC[facility] [address] [place], [place], [address] Phone: [phone]   Fax:  [phone]  Name: Luiz IronMary W Zaccone MRN: [id number] Date of Birth: 03-22-1929

## 2018-10-20 ENCOUNTER — Ambulatory Visit: Payer: Medicare Other

## 2018-10-20 DIAGNOSIS — M25562 Pain in left knee: Secondary | ICD-10-CM | POA: Diagnosis not present

## 2018-10-20 DIAGNOSIS — R262 Difficulty in walking, not elsewhere classified: Secondary | ICD-10-CM

## 2018-10-20 DIAGNOSIS — M6281 Muscle weakness (generalized): Secondary | ICD-10-CM

## 2018-10-20 NOTE — Therapy (Signed)
[facility] [address], [place], [address] Phone: [phone]   Fax:  [phone]  Physical Therapy Treatment  Patient Details  Name: Sheri Barker MRN: [id number] Date of Birth: 30-Jun-1929 Referring Provider (PT): Hooten   Encounter Date: 10/20/2018  PT End of Session - 10/20/18 1414    Visit Number  5    Number of Visits  17    Date for PT Re-Evaluation  12/01/18    Authorization Type  5 /10 (PN start 10/06/2018)    PT Start Time  1345    PT Stop Time  1430    PT Time Calculation (min)  45 min    Activity Tolerance  Patient tolerated treatment well;Patient limited by pain    Behavior During Therapy  [facility] for tasks assessed/performed       Past Medical History:  Diagnosis Date  . Arthritis   . Depression   . Elevated lipids   . Glaucoma    left eye  . Hypertension   . Hypothyroidism   . PONV (postoperative nausea and vomiting)     Past Surgical History:  Procedure Laterality Date  . ABDOMINAL HYSTERECTOMY    . CATARACT EXTRACTION W/ INTRAOCULAR LENS IMPLANT Bilateral   . JOINT REPLACEMENT    . KNEE ARTHROPLASTY Right 08/17/2015   Procedure: COMPUTER ASSISTED TOTAL KNEE ARTHROPLASTY;  Surgeon: Donato Heinz, MD;  Location: [facility] ORS;  Service: Orthopedics;  Laterality: Right;  . KNEE ARTHROPLASTY Left 09/17/2018   Procedure: COMPUTER ASSISTED TOTAL KNEE ARTHROPLASTY;  Surgeon: Donato Heinz, MD;  Location: [facility] ORS;  Service: Orthopedics;  Laterality: Left;  . KNEE ARTHROSCOPY Right   . OSTEOTOMY TOES    . TONSILLECTOMY      There were no vitals filed for this visit.  Subjective Assessment - 10/20/18 1404    Subjective  Patient reports she continues to improve in terms of pain and function. Patient states she was slightly light headed today with changing positions. Patient reports she'd like to start walking more.     Pertinent History  s/p L TKA 09/17/2018 and was discharged to [facility] 09/24/2018. After d/c from SNF, patient had HH PT for 5 visits (discharged 10/03/2018). Stitches removed 10/01/2018. Patient had R TKA 08/2015. PMH: osteoporosis, DDD, OA, glaucoma, HTN, hyperlipidemia.    Limitations  Lifting;Standing;Walking;House hold activities    How long can you sit comfortably?  unlimited    How long can you stand comfortably?  15 min     How long can you walk comfortably?  > 10 min household distances with [facility]    Patient Stated Goals  drive, walk dog, exercise program, church and community activities    Currently in Pain?  Yes    Pain Score  2     Pain Location  Knee    Pain Orientation  Left    Pain Descriptors / Indicators  Aching    Pain Type  Acute pain    Pain Onset  1 to 4 weeks ago    Pain Frequency  Intermittent       TREATMENT Therapeutic Exercise: SLR in sitting - x 15  Quad sets in sitting - x 15 with therapist support Squats in standing with UE support  -- x 10  Lunges Static in standing - x 10  Hip abduction in standing with GTB - x 10  Heel walking in standing - x 20 Leg press at [employer] -  x 20 55# Acsending/desending stairs -- x 5 with varying level of UE support and gait patterns Step ups onto 8" step  --  x 10 with UE support (higher NV) Amb over airex beam with hurdles - x 3 hurdles; x 6 times down and back   Manual therapy: STM performed to the quadriceps and hamstring musculature to decrease increased pain and spasms with patient in sitting. Patellar mobs sup/inf and laterally x30sec in each direction.    Patient demonstrates no increase in pain at the end of the session   PT Education - 10/20/18 1414    Education provided  Yes    Education Details  form/technique with exercises; Walking program (walking for 5 min)    Person(s) Educated  Patient    Methods  Explanation;Demonstration    Comprehension  Verbalized understanding;Returned demonstration       PT Short Term Goals - 10/07/18 1237      PT SHORT TERM GOAL #1    Title  Pt will be independent with HEP in order to improve strength and balance in order to decrease fall risk and improve function at home and work.     Baseline  not initiated    Time  2    Period  Weeks    Status  New    Target Date  10/20/18        PT Long Term Goals - 10/07/18 1237      PT LONG TERM GOAL #1   Title  Patient will demosntrate improved function with left LE for walking and daily activties with FOTO score improved to 56/100    Baseline  FOTO: 48/100    Time  8    Period  Weeks    Status  New    Target Date  12/02/18      PT LONG TERM GOAL #2   Title  Patient will achieve 125 degrees of active knee flexion and 0 degrees of active knee extension on the LLE in order to improve overall function and mobility.    Baseline  L knee flexion: 107; L knee extension lacking 5 degrees    Time  8    Period  Weeks    Status  New    Target Date  12/02/18      PT LONG TERM GOAL #3   Title  Patient will perform a 5TSTS with no UE use and no loss of balance from a standard height chair in under 15 seconds to demonstrate increased LE strength and decrease risk of falls.    Baseline  20.0 sec, no UE support    Time  8    Period  Weeks    Status  New    Target Date  12/02/18            Plan - 10/20/18 1418    Clinical Impression Statement  Patient demonstrates ability to perform higher level exercises today versus previous sessions indicating functional carryover between sessions. Although patient is improving she continues to demonstrate increased difficulty with dynamic balance requiring CGA assit to perform ambulaiton on compliant surfaces. Patient will benefit from further skilled therapy to return to prior level of function.      Rehab Potential  Good    PT Frequency  2x / week    PT Duration  8 weeks    PT Treatment/Interventions  Aquatic Therapy;Canalith Repostioning;Cryotherapy;Electrical Stimulation;Iontophoresis 4mg /ml Dexamethasone;Moist Heat;Ultrasound;Gait  training;Stair training;Functional mobility training;Therapeutic activities;Therapeutic exercise;Balance training;Neuromuscular re-education;Patient/family education;Manual techniques;Scar mobilization;Passive range  of motion;Dry needling;Energy conservation;Joint Manipulations;Taping    PT Next Visit Plan  review HH HEP and progress ROM and strengthening    PT Home Exercise Plan  continue with current HH HEP    Consulted and Agree with Plan of Care  Patient       Patient will benefit from skilled therapeutic intervention in order to improve the following deficits and impairments:  Abnormal gait, Improper body mechanics, Pain, Decreased scar mobility, Decreased mobility, Decreased endurance, Decreased activity tolerance, Decreased range of motion, Decreased strength, Hypomobility, Impaired flexibility, Difficulty walking, Decreased balance  Visit Diagnosis: Muscle weakness (generalized)  Left knee pain, unspecified chronicity  Difficulty in walking, not elsewhere classified     Problem List Patient Active Problem List   Diagnosis Date Noted  . Total knee replacement status 09/17/2018  . Primary osteoarthritis of left knee 06/22/2018  . Status post total right knee replacement 08/17/2015  . Cervical radiculitis 02/28/2015  . DDD (degenerative disc disease), cervical 02/28/2015  . Anxiety state 05/14/2014  . Atrophic vaginitis 05/14/2014  . Benign essential hypertension 05/14/2014  . Other and unspecified hyperlipidemia 05/14/2014  . Symptomatic menopausal or female climacteric states 05/14/2014    Myrene GalasWesley Amahia Madonia, PT DPT 10/20/2018, 2:30 PM  [facility] [address] [place], [place], [address] Phone: [phone]   Fax:  [phone]  Name: Luiz IronMary W Donofrio MRN: [id number] Date of Birth: 1929-04-26

## 2018-10-22 ENCOUNTER — Ambulatory Visit: Payer: Medicare Other

## 2018-10-22 DIAGNOSIS — M25562 Pain in left knee: Secondary | ICD-10-CM

## 2018-10-22 DIAGNOSIS — M6281 Muscle weakness (generalized): Secondary | ICD-10-CM

## 2018-10-22 DIAGNOSIS — R262 Difficulty in walking, not elsewhere classified: Secondary | ICD-10-CM

## 2018-10-22 NOTE — Therapy (Signed)
[facility] [address] [place], [place], [address] Phone: [phone]   Fax:  [phone]  Physical Therapy Treatment  Patient Details  Name: Sheri Barker MRN: [id number] Date of Birth: 08-14-1929 Referring Provider (PT): Hooten   Encounter Date: 10/22/2018    Past Medical History:  Diagnosis Date  . Arthritis   . Depression   . Elevated lipids   . Glaucoma    left eye  . Hypertension   . Hypothyroidism   . PONV (postoperative nausea and vomiting)     Past Surgical History:  Procedure Laterality Date  . ABDOMINAL HYSTERECTOMY    . CATARACT EXTRACTION W/ INTRAOCULAR LENS IMPLANT Bilateral   . JOINT REPLACEMENT    . KNEE ARTHROPLASTY Right 08/17/2015   Procedure: COMPUTER ASSISTED TOTAL KNEE ARTHROPLASTY;  Surgeon: Donato HeinzJames P Hooten, MD;  Location: [facility]S;  Service: Orthopedics;  Laterality: Right;  . KNEE ARTHROPLASTY Left 09/17/2018   Procedure: COMPUTER ASSISTED TOTAL KNEE ARTHROPLASTY;  Surgeon: Donato HeinzHooten, James P, MD;  Location: [facility] OR[facility];  Service: Orthopedics;  Laterality: Left;  . KNEE ARTHROSCOPY Right   . OSTEOTOMY TOES    . TONSILLECTOMY      There were no vitals filed for this visit.  Subjective Assessment - 10/22/18 1343    Subjective  Patient states she is feeling much better today than she did the previous session. Patient states no other major changes.     Pertinent History  s/p L TKA 09/17/2018 and was [facility]. After d/c from SNF, patient had HH PT for 5 visits (discharged 10/03/2018). Stitches removed 10/01/2018. Patient had R TKA 08/2015. PMH: osteoporosis, DDD, OA, glaucoma, HTN, hyperlipidemia.    Limitations  Lifting;Standing;Walking;House hold activities    How long can you sit comfortably?  unlimited    How long can you stand comfortably?  15 min     How long can you walk comfortably?  > 10 min [facility] with [facility]    Patient Stated Goals   drive, walk dog, exercise program, church and community activities    Currently in Pain?  No/denies    Pain Onset  1 to 4 weeks ago       TREATMENT Therapeutic Exercise: SLR in sitting - x 15  Step ups onto 8" step  --  x 10 with UE support; x 10 6" step Hip abduction in standing with GTB - 2 x 10  Mini single leg squats in standing with pushing ball in the wall with the elevated leg - x 10 B Leg press at [employer] - x 10 unilaterally B 25# Standing lunges in the doorway (most 80% of weight on forward foot) - x12 Hip semicircular abductions with furniture sliders - x 13 B   Manual therapy: STM performed to the quadriceps and hamstring musculature to decrease increased pain and spasms with patient in sitting. Patellar mobs sup/inf and laterally x30sec in each direction.    Patient demonstrates no increase in pain at the end of the session   PT Education - 10/22/18 1344    Education provided  Yes    Education Details  Conitnue walking program; form/technique with exercise    Person(s) Educated  Patient    Methods  Explanation;Demonstration    Comprehension  Verbalized understanding;Returned demonstration       PT Short Term Goals - 10/07/18 1237      PT SHORT TERM GOAL #1   Title  Pt will  be independent with HEP in order to improve strength and balance in order to decrease fall risk and improve function at home and work.     Baseline  not initiated    Time  2    Period  Weeks    Status  New    Target Date  10/20/18        PT Long Term Goals - 10/07/18 1237      PT LONG TERM GOAL #1   Title  Patient will demosntrate improved function with left LE for walking and daily activties with FOTO score improved to 56/100    Baseline  FOTO: 48/100    Time  8    Period  Weeks    Status  New    Target Date  12/02/18      PT LONG TERM GOAL #2   Title  Patient will achieve 125 degrees of active knee flexion and 0 degrees of active knee extension on the LLE in order to improve  overall function and mobility.    Baseline  L knee flexion: 107; L knee extension lacking 5 degrees    Time  8    Period  Weeks    Status  New    Target Date  12/02/18      PT LONG TERM GOAL #3   Title  Patient will perform a 5TSTS with no UE use and no loss of balance from a standard height chair in under 15 seconds to demonstrate increased LE strength and decrease risk of falls.    Baseline  20.0 sec, no UE support    Time  8    Period  Weeks    Status  New    Target Date  12/02/18            Plan - 10/22/18 1344    Clinical Impression Statement  Patient demonstrates improvement with exercise performance today with ability to perform greater amount of single leg exercises without increase in pain. Patient continues to demonstrates increased hip weakness and poor coordination most notably with taking a step over 8" in height. Patient will benefit from further skilled therapy to return to prior level of function.     Rehab Potential  Good    PT Frequency  2x / week    PT Duration  8 weeks    PT Treatment/Interventions  Aquatic Therapy;Canalith Repostioning;Cryotherapy;Electrical Stimulation;Iontophoresis 4mg /ml Dexamethasone;Moist Heat;Ultrasound;Gait training;Stair training;Functional mobility training;Therapeutic activities;Therapeutic exercise;Balance training;Neuromuscular re-education;Patient/family education;Manual techniques;Scar mobilization;Passive range of motion;Dry needling;Energy conservation;Joint Manipulations;Taping    PT Next Visit Plan  review HH HEP and progress ROM and strengthening    PT Home Exercise Plan  continue with current HH HEP    Consulted and Agree with Plan of Care  Patient       Patient will benefit from skilled therapeutic intervention in order to improve the following deficits and impairments:  Abnormal gait, Improper body mechanics, Pain, Decreased scar mobility, Decreased mobility, Decreased endurance, Decreased activity tolerance, Decreased  range of motion, Decreased strength, Hypomobility, Impaired flexibility, Difficulty walking, Decreased balance  Visit Diagnosis: Muscle weakness (generalized)  Left knee pain, unspecified chronicity  Difficulty in walking, not elsewhere classified     Problem List Patient Active Problem List   Diagnosis Date Noted  . Total knee replacement status 09/17/2018  . Primary osteoarthritis of left knee 06/22/2018  . Status post total right knee replacement 08/17/2015  . Cervical radiculitis 02/28/2015  . DDD (degenerative disc disease), cervical 02/28/2015  . Anxiety  state 05/14/2014  . Atrophic vaginitis 05/14/2014  . Benign essential hypertension 05/14/2014  . Other and unspecified hyperlipidemia 05/14/2014  . Symptomatic menopausal or female climacteric states 05/14/2014    Myrene Galas, PT DPT 10/22/2018, 1:48 PM  [facility] PHYSICAL AND SPORTS MEDICINE [address], [place], [address] Phone: [phone]   Fax:  [phone]  Name: CICI RODRIGES MRN: [id number] Date of Birth: 04/19/1929

## 2018-10-27 ENCOUNTER — Ambulatory Visit: Payer: Medicare Other

## 2018-10-27 DIAGNOSIS — R262 Difficulty in walking, not elsewhere classified: Secondary | ICD-10-CM

## 2018-10-27 DIAGNOSIS — M25662 Stiffness of left knee, not elsewhere classified: Secondary | ICD-10-CM

## 2018-10-27 DIAGNOSIS — M25562 Pain in left knee: Secondary | ICD-10-CM | POA: Diagnosis not present

## 2018-10-27 DIAGNOSIS — M6281 Muscle weakness (generalized): Secondary | ICD-10-CM

## 2018-10-27 NOTE — Therapy (Signed)
[facility] [address] [place], [place], [address] Phone: [phone]   Fax:  [phone]  Physical Therapy Treatment  Patient Details  Name: Sheri Barker Minix MRN: [id number] Date of Birth: [birth year] Referring Provider (PT): Hooten   Encounter Date: 10/27/2018  PT End of Session - 10/27/18 1257    Visit Number  6    Number of Visits  17    Date for PT Re-Evaluation  12/01/18    Authorization Type  6 /10 (PN start 10/06/2018)    Authorization Time Period  4/6 FOTO    PT Start Time  1300    PT Stop Time  1344    PT Time Calculation (min)  44 min    Activity Tolerance  Patient tolerated treatment well    Behavior During Therapy  [facility] for tasks assessed/performed       Past Medical History:  Diagnosis Date  . Arthritis   . Depression   . Elevated lipids   . Glaucoma    left eye  . Hypertension   . Hypothyroidism   . PONV (postoperative nausea and vomiting)     Past Surgical History:  Procedure Laterality Date  . ABDOMINAL HYSTERECTOMY    . CATARACT EXTRACTION Barker/ INTRAOCULAR LENS IMPLANT Bilateral   . JOINT REPLACEMENT    . KNEE ARTHROPLASTY Right 08/17/2015   Procedure: COMPUTER ASSISTED TOTAL KNEE ARTHROPLASTY;  Surgeon: Donato HeinzJames P Hooten, MD;  Location: [facility] OR[facility];  Service: Orthopedics;  Laterality: Right;  . KNEE ARTHROPLASTY Left 09/17/2018   Procedure: COMPUTER ASSISTED TOTAL KNEE ARTHROPLASTY;  Surgeon: Donato HeinzHooten, James P, MD;  Location: [facility] ORS;  Service: Orthopedics;  Laterality: Left;  . KNEE ARTHROSCOPY Right   . OSTEOTOMY TOES    . TONSILLECTOMY      There were no vitals filed for this visit.  Subjective Assessment - 10/27/18 1303    Subjective  Pt has had tylenol around 8am this morning. Does endorse some R knee pain. Reported fair compliance with HEP.    Pertinent History  s/p L TKA 09/17/2018 and was [facility]. After d/c from SNF, patient had HH PT for 5 visits  (discharged 10/03/2018). Stitches removed 10/01/2018. Patient had R TKA 08/2015. PMH: osteoporosis, DDD, OA, glaucoma, HTN, hyperlipidemia.    Limitations  Lifting;Standing;Walking;House hold activities    How long can you sit comfortably?  unlimited    How long can you stand comfortably?  15 min     How long can you walk comfortably?  > 10 min household distances with [facility]    Patient Stated Goals  drive, walk dog, exercise program, church and community activities    Currently in Pain?  Yes    Pain Score  4     Pain Location  Knee    Pain Orientation  Left    Pain Descriptors / Indicators  Aching    Pain Onset  More than a month ago       TREATMENT Therapeutic Exercise: Nustep x134mins level 2(unbilled) for history, warmup  SLR bilaterally  in sitting - x 15  SLR with ER bilaterally in sitting x15 Step ups onto 6" step 2x10 bilaterally Hip abduction in standing with GTB - 2 x 10  Mini single leg squats in standing with pushing ball in the wall with the Leg leg - x 10 B Leg press at [employer] - x 12 unilaterally B 25# with focus on eccentric control Standing lunges in  the doorway x10 with bolster underneath for tactile cue Hip semicircular abductions with furniture sliders - x 15 B  Manual therapy: x77minutes STM performed to the quadriceps and hamstring musculature to decrease increased pain and spasms with patient in supine. Patellar mobs sup/inf and laterally x30sec in each direction.    PT Education - 10/27/18 1257    Education provided  Yes    Education Details  exercise technique/form    Person(s) Educated  Patient    Methods  Explanation;Demonstration    Comprehension  Verbalized understanding       PT Short Term Goals - 10/07/18 1237      PT SHORT TERM GOAL #1   Title  Pt will be independent with HEP in order to improve strength and balance in order to decrease fall risk and improve function at home and work.     Baseline  not initiated    Time  2    Period  Weeks     Status  New    Target Date  10/20/18        PT Long Term Goals - 10/07/18 1237      PT LONG TERM GOAL #1   Title  Patient will demosntrate improved function with left LE for walking and daily activties with FOTO score improved to 56/100    Baseline  FOTO: 48/100    Time  8    Period  Weeks    Status  New    Target Date  12/02/18      PT LONG TERM GOAL #2   Title  Patient will achieve 125 degrees of active knee flexion and 0 degrees of active knee extension on the LLE in order to improve overall function and mobility.    Baseline  L knee flexion: 107; L knee extension lacking 5 degrees    Time  8    Period  Weeks    Status  New    Target Date  12/02/18      PT LONG TERM GOAL #3   Title  Patient will perform a 5TSTS with no UE use and no loss of balance from a standard height chair in under 15 seconds to demonstrate increased LE strength and decrease risk of falls.    Baseline  20.0 sec, no UE support    Time  8    Period  Weeks    Status  New    Target Date  12/02/18            Plan - 10/27/18 1340    Clinical Impression Statement  Patient demonstrated good tolerance to activities today. No complaints of pain. Most challenged by lunges bilaterally. Focused on eccentric control on leg press to promote strength/control. Pt able to navigate normal staircase with UE support with step over pattern.     Rehab Potential  Good    Clinical Impairments Affecting Rehab Potential  (+)motivated, active PLOF(-)age, comorbidities: arthritis    PT Frequency  2x / week    PT Duration  8 weeks    PT Treatment/Interventions  Aquatic Therapy;Canalith Repostioning;Cryotherapy;Electrical Stimulation;Iontophoresis 4mg /ml Dexamethasone;Moist Heat;Ultrasound;Gait training;Stair training;Functional mobility training;Therapeutic activities;Therapeutic exercise;Balance training;Neuromuscular re-education;Patient/family education;Manual techniques;Scar mobilization;Passive range of motion;Dry  needling;Energy conservation;Joint Manipulations;Taping    PT Next Visit Plan  review HH HEP and progress ROM and strengthening    PT Home Exercise Plan  continue with current HH HEP    Consulted and Agree with Plan of Care  Patient  Patient will benefit from skilled therapeutic intervention in order to improve the following deficits and impairments:  Abnormal gait, Improper body mechanics, Pain, Decreased scar mobility, Decreased mobility, Decreased endurance, Decreased activity tolerance, Decreased range of motion, Decreased strength, Hypomobility, Impaired flexibility, Difficulty walking, Decreased balance  Visit Diagnosis: Muscle weakness (generalized)  Left knee pain, unspecified chronicity  Difficulty in walking, not elsewhere classified  Decreased range of motion (ROM) of left knee     Problem List Patient Active Problem List   Diagnosis Date Noted  . Total knee replacement status 09/17/2018  . Primary osteoarthritis of left knee 06/22/2018  . Status post total right knee replacement 08/17/2015  . Cervical radiculitis 02/28/2015  . DDD (degenerative disc disease), cervical 02/28/2015  . Anxiety state 05/14/2014  . Atrophic vaginitis 05/14/2014  . Benign essential hypertension 05/14/2014  . Other and unspecified hyperlipidemia 05/14/2014  . Symptomatic menopausal or female climacteric states 05/14/2014    Olga Coasteriana Iyanla Eilers PT, DPT 1:45 PM,10/27/18 [phone]  [facility] [address] [place], [place], [address] Phone: [phone]   Fax:  [phone]  Name: Sheri Barker Orrison MRN: [id number] Date of Birth: Apr 13, 1929

## 2018-11-03 ENCOUNTER — Ambulatory Visit: Payer: Medicare Other

## 2018-11-03 DIAGNOSIS — M25562 Pain in left knee: Secondary | ICD-10-CM

## 2018-11-03 DIAGNOSIS — M6281 Muscle weakness (generalized): Secondary | ICD-10-CM

## 2018-11-03 DIAGNOSIS — R262 Difficulty in walking, not elsewhere classified: Secondary | ICD-10-CM

## 2018-11-03 NOTE — Therapy (Signed)
[facility] PHYSICAL AND SPOR[facility] MEDICINE [address] [place], [place], [address] Phone: [phone]   Fax:  [phone]  Physical Therapy Treatment  Patient Details  Name: Sheri Barker MRN: [id number] Date of Birth: 1928/12/10 Referring Provider (PT): Hooten   Encounter Date: 11/03/2018  PT End of Session - 11/03/18 1319    Visit Number  7    Number of Visits  17    Date for PT Re-Evaluation  12/01/18    Authorization Type  7 /10 (PN start 10/06/2018)    Authorization Time Period  5/6 FOTO    PT Start Time  1303    PT Stop Time  1345    PT Time Calculation (min)  42 min    Activity Tolerance  Patient tolerated treatment well    Behavior During Therapy  [facility] for tasks assessed/performed       Past Medical History:  Diagnosis Date  . Arthritis   . Depression   . Elevated lipids   . Glaucoma    left eye  . Hypertension   . Hypothyroidism   . PONV (postoperative nausea and vomiting)     Past Surgical History:  Procedure Laterality Date  . ABDOMINAL HYSTERECTOMY    . CATARACT EXTRACTION W/ INTRAOCULAR LENS IMPLANT Bilateral   . JOINT REPLACEMENT    . KNEE ARTHROPLASTY Right 08/17/2015   Procedure: COMPUTER ASSISTED TOTAL KNEE ARTHROPLASTY;  Surgeon: Donato HeinzJames P Hooten, MD;  Location: [facility] ORS;  Service: Orthopedics;  Laterality: Right;  . KNEE ARTHROPLASTY Left 09/17/2018   Procedure: COMPUTER ASSISTED TOTAL KNEE ARTHROPLASTY;  Surgeon: Donato HeinzHooten, James P, MD;  Location: [facility] ORS;  Service: Orthopedics;  Laterality: Left;  . KNEE ARTHROSCOPY Right   . OSTEOTOMY TOES    . TONSILLECTOMY      There were no vitals filed for this visit.  Subjective Assessment - 11/03/18 1314    Subjective  Patient reports she continues to have swelling in her  knee but states she is overall improving.    Pertinent History  s/p L TKA 09/17/2018 and was [facility]. After d/c from SNF, patient had HH PT for 5 visits (discharged  10/03/2018). Stitches removed 10/01/2018. Patient had R TKA 08/2015. PMH: osteoporosis, DDD, OA, glaucoma, HTN, hyperlipidemia.    Limitations  Lifting;Standing;Walking;House hold activities    How long can you sit comfortably?  unlimited    How long can you stand comfortably?  15 min     How long can you walk comfortably?  > 10 min [facility] distances [facility]    Patient Stated Goals  drive, walk dog, exercise program, church and community activities    Currently in Pain?  Yes    Pain Score  3     Pain Location  Knee    Pain Orientation  Left    Pain Descriptors / Indicators  Aching    Pain Onset  More than a month ago    Pain Frequency  Intermittent       TREATMENT Therapeutic Exercise: Ascending/desending the stairs - x 10 (4 steps) SLR bilaterally in sitting - x 15  Mini single leg squats in standing with pushing ball in the wall with the Leg leg - x 10 B Leg press at OMEGA - x 12 unilaterally B 65#, unilateral 35# with focus on eccentric control Step ups onto 8" step -- 2x10 bilaterally Lateral lunges in standing with unilateral UE support - x 20  Single leg  stance on airex pad - x 1 min B  Hip abduction in standing with GTB - 2 x 10    Manual therapy: x3610minutes STM performed to the quadriceps and hamstring musculature to decrease increased pain and spasms with patient in supine. Patellar mobs sup/inf and laterally x30sec in each direction.    Patient demonstrates increased fatigue at end of the session   PT Education - 11/03/18 1318    Education provided  Yes    Education Details  form/technique with exercise    Person(s) Educated  Patient    Methods  Explanation;Demonstration    Comprehension  Verbalized understanding;Returned demonstration       PT Short Term Goals - 10/07/18 1237      PT SHORT TERM GOAL #1   Title  Pt will be independent with HEP in order to improve strength and balance in order to decrease fall risk and improve function at home and work.      Baseline  not initiated    Time  2    Period  Weeks    Status  New    Target Date  10/20/18        PT Long Term Goals - 10/07/18 1237      PT LONG TERM GOAL #1   Title  Patient will demosntrate improved function with left LE for walking and daily activties with FOTO score improved to 56/100    Baseline  FOTO: 48/100    Time  8    Period  Weeks    Status  New    Target Date  12/02/18      PT LONG TERM GOAL #2   Title  Patient will achieve 125 degrees of active knee flexion and 0 degrees of active knee extension on the LLE in order to improve overall function and mobility.    Baseline  L knee flexion: 107; L knee extension lacking 5 degrees    Time  8    Period  Weeks    Status  New    Target Date  12/02/18      PT LONG TERM GOAL #3   Title  Patient will perform a 5TSTS with no UE use and no loss of balance from a standard height chair in under 15 seconds to demonstrate increased LE strength and decrease risk of falls.    Baseline  20.0 sec, no UE support    Time  8    Period  Weeks    Status  New    Target Date  12/02/18            Plan - 11/03/18 1327    Clinical Impression Statement  Patient demonstrates improvement with ability to perform steps from a higher position compared to previous sessions indicating functional carryover between sessions. Patient demonstrates a knee flexion angle of 121degrees of the L indicating improvement in AROM. Patient continues to demonstrates decreased quad strength and will benefit from further skilled therapy to return to prior level of function.     Rehab Potential  Good    Clinical Impairments Affecting Rehab Potential  (+)motivated, active PLOF(-)age, comorbidities: arthritis    PT Frequency  2x / week    PT Duration  8 weeks    PT Treatment/Interventions  Aquatic Therapy;Canalith Repostioning;Cryotherapy;Electrical Stimulation;Iontophoresis 4mg /ml Dexamethasone;Moist Heat;Ultrasound;Gait training;Stair training;Functional  mobility training;Therapeutic activities;Therapeutic exercise;Balance training;Neuromuscular re-education;Patient/family education;Manual techniques;Scar mobilization;Passive range of motion;Dry needling;Energy conservation;Joint Manipulations;Taping    PT Next Visit Plan  review HH HEP and progress  ROM and strengthening    PT Home Exercise Plan  continue with current HH HEP    Consulted and Agree with Plan of Care  Patient       Patient will benefit from skilled therapeutic intervention in order to improve the following deficits and impairments:  Abnormal gait, Improper body mechanics, Pain, Decreased scar mobility, Decreased mobility, Decreased endurance, Decreased activity tolerance, Decreased range of motion, Decreased strength, Hypomobility, Impaired flexibility, Difficulty walking, Decreased balance  Visit Diagnosis: Muscle weakness (generalized)  Left knee pain, unspecified chronicity  Difficulty in walking, not elsewhere classified     Problem List Patient Active Problem List   Diagnosis Date Noted  . Total knee replacement status 09/17/2018  . Primary osteoarthritis of left knee 06/22/2018  . Status post total right knee replacement 08/17/2015  . Cervical radiculitis 02/28/2015  . DDD (degenerative disc disease), cervical 02/28/2015  . Anxiety state 05/14/2014  . Atrophic vaginitis 05/14/2014  . Benign essential hypertension 05/14/2014  . Other and unspecified hyperlipidemia 05/14/2014  . Symptomatic menopausal or female climacteric states 05/14/2014    Myrene Galas, PT DPT 11/03/2018, 1:46 PM  [place] [facility] PHYSICAL AND SPORTS MEDICINE [address] [place], [address] Phone: [phone]   Fax:  [phone]  Name: TANAZIA ACHEE MRN: [id number] Date of Birth: [birth year]

## 2018-11-07 ENCOUNTER — Ambulatory Visit
Admission: RE | Admit: 2018-11-07 | Discharge: 2018-11-07 | Disposition: A | Discharge status: Home / Self Care | Payer: Medicare Other | Source: Ambulatory Visit | Attending: Family Medicine | Admitting: Family Medicine

## 2018-11-07 DIAGNOSIS — Z1231 Encounter for screening mammogram for malignant neoplasm of breast: Secondary | ICD-10-CM | POA: Insufficient documentation

## 2018-11-10 ENCOUNTER — Ambulatory Visit: Payer: Medicare Other | Attending: Orthopedic Surgery

## 2018-11-10 DIAGNOSIS — R262 Difficulty in walking, not elsewhere classified: Secondary | ICD-10-CM | POA: Diagnosis present

## 2018-11-10 DIAGNOSIS — M25562 Pain in left knee: Secondary | ICD-10-CM | POA: Diagnosis present

## 2018-11-10 DIAGNOSIS — M6281 Muscle weakness (generalized): Secondary | ICD-10-CM | POA: Insufficient documentation

## 2018-11-10 NOTE — Therapy (Signed)
[place] [facility] PHYSICAL [facility] MEDICINE [address] [place], [place], [address] Phone: [phone]   Fax:  [phone]  Physical Therapy Treatment  Patient Details  Name: Sheri Barker MRN: [id number] Date of Birth: 1928-12-04 Referring Provider (PT): Hooten   Encounter Date: 11/10/2018  PT End of Session - 11/10/18 1321    Visit Number  8    Number of Visits  17    Date for PT Re-Evaluation  12/01/18    Authorization Type  8 /10 (PN start 10/06/2018)    PT Start Time  1302    PT Stop Time  1345    PT Time Calculation (min)  43 min    Activity Tolerance  Patient tolerated treatment well    Behavior During Therapy  [facility] for tasks assessed/performed       Past Medical History:  Diagnosis Date  . Arthritis   . Depression   . Elevated lipids   . Glaucoma    left eye  . Hypertension   . Hypothyroidism   . PONV (postoperative nausea and vomiting)     Past Surgical History:  Procedure Laterality Date  . ABDOMINAL HYSTERECTOMY    . CATARACT EXTRACTION W/ INTRAOCULAR LENS IMPLANT Bilateral   . JOINT REPLACEMENT    . KNEE ARTHROPLASTY Right 08/17/2015   Procedure: COMPUTER ASSISTED TOTAL KNEE ARTHROPLASTY;  Surgeon: Donato HeinzJames P Hooten, MD;  Location: [facility] ORS;  Service: Orthopedics;  Laterality: Right;  . KNEE ARTHROPLASTY Left 09/17/2018   Procedure: COMPUTER ASSISTED TOTAL KNEE ARTHROPLASTY;  Surgeon: Donato HeinzHooten, James P, MD;  Location: [facility] ORS;  Service: Orthopedics;  Laterality: Left;  . KNEE ARTHROSCOPY Right   . OSTEOTOMY TOES    . TONSILLECTOMY      There were no vitals filed for this visit.  Subjective Assessment - 11/10/18 1317    Subjective  Patient reports she feels limited in terms of walking and states she is unable to walk her dog. Patient reports she has a stitch in her knee that has not yet dissolved.     Pertinent History  s/p L TKA 09/17/2018 and was [facility]. After d/c from SNF, patient had HH  PT for 5 visits (discharged 10/03/2018). Stitches removed 10/01/2018. Patient had R TKA 08/2015. PMH: osteoporosis, DDD, OA, glaucoma, HTN, hyperlipidemia.    Limitations  Lifting;Standing;Walking;House hold activities    How long can you sit comfortably?  unlimited    How long can you stand comfortably?  15 min     How long can you walk comfortably?  > 10 min household distances with [facility]    Patient Stated Goals  drive, walk dog, exercise program, church and community activities    Currently in Pain?  No/denies    Pain Onset  More than a month ago       TREATMENT Therapeutic Exercise: Leg press at OMEGA -x 12unilaterally B 65#, unilateral 35#with focus on eccentric control Step upsonto 8" step -- 2x10 bilaterally Lateral lunges in standing with unilateral UE support - x 20 Forward lunges in standing with unilateral UE support -- x 20  Walking with pertubations from another human -- 45500ft Hip abduction in standing with YTB -2 x 10  Cone taps from airex pad -- 2 x 10 with intermittent UE support  Patient demonstrates increased fatigue at end of the session   PT Education - 11/10/18 1320    Education provided  Yes    Education Details  form/technique  with exercise    Person(s) Educated  Patient    Methods  Explanation;Demonstration    Comprehension  Verbalized understanding;Returned demonstration       PT Short Term Goals - 10/07/18 1237      PT SHORT TERM GOAL #1   Title  Pt will be independent with HEP in order to improve strength and balance in order to decrease fall risk and improve function at home and work.     Baseline  not initiated    Time  2    Period  Weeks    Status  New    Target Date  10/20/18        PT Long Term Goals - 10/07/18 1237      PT LONG TERM GOAL #1   Title  Patient will demosntrate improved function with left LE for walking and daily activties with FOTO score improved to 56/100    Baseline  FOTO: 48/100    Time  8    Period  Weeks     Status  New    Target Date  12/02/18      PT LONG TERM GOAL #2   Title  Patient will achieve 125 degrees of active knee flexion and 0 degrees of active knee extension on the LLE in order to improve overall function and mobility.    Baseline  L knee flexion: 107; L knee extension lacking 5 degrees    Time  8    Period  Weeks    Status  New    Target Date  12/02/18      PT LONG TERM GOAL #3   Title  Patient will perform a 5TSTS with no UE use and no loss of balance from a standard height chair in under 15 seconds to demonstrate increased LE strength and decrease risk of falls.    Baseline  20.0 sec, no UE support    Time  8    Period  Weeks    Status  New    Target Date  12/02/18            Plan - 11/10/18 1322    Clinical Impression Statement  Performed exercises in standing focusing on improving muscular endurance in standing as patient's major complaint seems to be walking. Patient demonstrates poor motor control and often requires cueing for knee positioning with exercises. Patient will benefit from further skilled therapy to return to prior level of function.     Rehab Potential  Good    Clinical Impairments Affecting Rehab Potential  (+)motivated, active PLOF(-)age, comorbidities: arthritis    PT Frequency  2x / week    PT Duration  8 weeks    PT Treatment/Interventions  Aquatic Therapy;Canalith Repostioning;Cryotherapy;Electrical Stimulation;Iontophoresis 4mg /ml Dexamethasone;Moist Heat;Ultrasound;Gait training;Stair training;Functional mobility training;Therapeutic activities;Therapeutic exercise;Balance training;Neuromuscular re-education;Patient/family education;Manual techniques;Scar mobilization;Passive range of motion;Dry needling;Energy conservation;Joint Manipulations;Taping    PT Next Visit Plan  review HH HEP and progress ROM and strengthening    PT Home Exercise Plan  continue with current HH HEP    Consulted and Agree with Plan of Care  Patient       Patient  will benefit from skilled therapeutic intervention in order to improve the following deficits and impairments:  Abnormal gait, Improper body mechanics, Pain, Decreased scar mobility, Decreased mobility, Decreased endurance, Decreased activity tolerance, Decreased range of motion, Decreased strength, Hypomobility, Impaired flexibility, Difficulty walking, Decreased balance  Visit Diagnosis: Muscle weakness (generalized)  Left knee pain, unspecified chronicity  Difficulty in walking,  not elsewhere classified     Problem List Patient Active Problem List   Diagnosis Date Noted  . Total knee replacement status 09/17/2018  . Primary osteoarthritis of left knee 06/22/2018  . Status post total right knee replacement 08/17/2015  . Cervical radiculitis 02/28/2015  . DDD (degenerative disc disease), cervical 02/28/2015  . Anxiety state 05/14/2014  . Atrophic vaginitis 05/14/2014  . Benign essential hypertension 05/14/2014  . Other and unspecified hyperlipidemia 05/14/2014  . Symptomatic menopausal or female climacteric states 05/14/2014    Myrene Galas, PT DPT 11/10/2018, 1:26 PM  [place] [facility] PHYSICAL AND SPORTS MEDICINE [address] [place], [address] Phone: [phone]   Fax:  [phone]  Name: Sheri Barker MRN: [id number] Date of Birth: Apr 22, 1929

## 2018-11-13 ENCOUNTER — Ambulatory Visit: Payer: Medicare Other

## 2018-11-13 DIAGNOSIS — M6281 Muscle weakness (generalized): Secondary | ICD-10-CM | POA: Diagnosis not present

## 2018-11-13 DIAGNOSIS — M25562 Pain in left knee: Secondary | ICD-10-CM

## 2018-11-13 DIAGNOSIS — R262 Difficulty in walking, not elsewhere classified: Secondary | ICD-10-CM

## 2018-11-13 NOTE — Therapy (Signed)
[facility] [address] [place], [place], [address] Phone: [phone]   Fax:  [phone]  Physical Therapy Treatment  Patient Details  Name: Sheri Barker MRN: [id number] Date of Birth: Oct 05, 1929 Referring Provider (PT): Hooten   Encounter Date: 11/13/2018  PT End of Session - 11/13/18 1130    Visit Number  9    Number of Visits  17    Date for PT Re-Evaluation  12/01/18    Authorization Type  9 /10 (PN start 10/06/2018)    PT Start Time  1115    PT Stop Time  1200    PT Time Calculation (min)  45 min    Activity Tolerance  Patient tolerated treatment well    Behavior During Therapy  [facility] for tasks assessed/performed       Past Medical History:  Diagnosis Date  . Arthritis   . Depression   . Elevated lipids   . Glaucoma    left eye  . Hypertension   . Hypothyroidism   . PONV (postoperative nausea and vomiting)     Past Surgical History:  Procedure Laterality Date  . ABDOMINAL HYSTERECTOMY    . CATARACT EXTRACTION W/ INTRAOCULAR LENS IMPLANT Bilateral   . JOINT REPLACEMENT    . KNEE ARTHROPLASTY Right 08/17/2015   Procedure: COMPUTER ASSISTED TOTAL KNEE ARTHROPLASTY;  Surgeon: Donato HeinzJames P Hooten, MD;  Location: [facility] ORS;  Service: Orthopedics;  Laterality: Right;  . KNEE ARTHROPLASTY Left 09/17/2018   Procedure: COMPUTER ASSISTED TOTAL KNEE ARTHROPLASTY;  Surgeon: Donato HeinzHooten, James P, MD;  Location: [facility] ORS;  Service: Orthopedics;  Laterality: Left;  . KNEE ARTHROSCOPY Right   . OSTEOTOMY TOES    . TONSILLECTOMY      There were no vitals filed for this visit.  Subjective Assessment - 11/13/18 1127    Subjective  Patient reports no major changes since the previous session, patient states improvement with pain and spasms and states increased confidence with walking and going out in the community.     Pertinent History  s/p L TKA 09/17/2018 and [facility]. After d/c from  SNF, patient had HH PT for 5 visits (discharged 10/03/2018). Stitches removed 10/01/2018. Patient had R TKA 08/2015. PMH: osteoporosis, DDD, OA, glaucoma, HTN, hyperlipidemia.    Limitations  Lifting;Standing;Walking;House hold activities    How long can you sit comfortably?  unlimited    How long can you stand comfortably?  15 min     How long can you walk comfortably?  > 10 min [facility] distances with [facility]    Patient Stated Goals  drive, walk dog, exercise program, church and community activities    Currently in Pain?  No/denies    Pain Onset  More than a month ago       TREATMENT Therapeutic Exercise: Leg press at OMEGA - 2 x 10 B 75#, unilaterally 35# with focus on eccentric control Step ups onto 8" step -- 2x15 bilaterally Hip abduction in standing with GTB - 2 x 15 Hip extension in standing with GTB - 2 x 15  Step up with holds on a single leg for balance on airex - 2 x 10  Side stepping over cone ~ 16" in height - x 12 B Forward ambulation over 3 hurdles - x4 5033ft each length with holding 10# weight; x 4 without hurdles Holding 9.5# box with performing ascending/descending stairs - x 5 (4 stairs)    Patient demonstrates  increased fatigue at end of the session; Performed exercise to challenge patient's strength and build strength with performance of functional ADLs such as carrying boxes around her home to organize her house   PT Education - 11/13/18 1129    Education provided  Yes    Education Details  form/technique with exercise    Person(s) Educated  Patient    Methods  Explanation;Demonstration    Comprehension  Verbalized understanding;Returned demonstration       PT Short Term Goals - 10/07/18 1237      PT SHORT TERM GOAL #1   Title  Pt will be independent with HEP in order to improve strength and balance in order to decrease fall risk and improve function at home and work.     Baseline  not initiated    Time  2    Period  Weeks    Status  New    Target Date   10/20/18        PT Long Term Goals - 10/07/18 1237      PT LONG TERM GOAL #1   Title  Patient will demosntrate improved function with left LE for walking and daily activties with FOTO score improved to 56/100    Baseline  FOTO: 48/100    Time  8    Period  Weeks    Status  New    Target Date  12/02/18      PT LONG TERM GOAL #2   Title  Patient will achieve 125 degrees of active knee flexion and 0 degrees of active knee extension on the LLE in order to improve overall function and mobility.    Baseline  L knee flexion: 107; L knee extension lacking 5 degrees    Time  8    Period  Weeks    Status  New    Target Date  12/02/18      PT LONG TERM GOAL #3   Title  Patient will perform a 5TSTS with no UE use and no loss of balance from a standard height chair in under 15 seconds to demonstrate increased LE strength and decrease risk of falls.    Baseline  20.0 sec, no UE support    Time  8    Period  Weeks    Status  New    Target Date  12/02/18            Plan - 11/13/18 1201    Clinical Impression Statement  Performed exercises to improve overall strength to improve ability to perform activities around her home. Patient demonstrates improvement with strength with ability to perform greater amount of exercises compared to previous session with greater amount of resistance. Although patient is improving, she continues to demonstrate poor hip/knee coordination and will benefit from further skilled therapy to return to prior level of function.     Rehab Potential  Good    Clinical Impairments Affecting Rehab Potential  (+)motivated, active PLOF(-)age, comorbidities: arthritis    PT Frequency  2x / week    PT Duration  8 weeks    PT Treatment/Interventions  Aquatic Therapy;Canalith Repostioning;Cryotherapy;Electrical Stimulation;Iontophoresis 4mg /ml Dexamethasone;Moist Heat;Ultrasound;Gait training;Stair training;Functional mobility training;Therapeutic activities;Therapeutic  exercise;Balance training;Neuromuscular re-education;Patient/family education;Manual techniques;Scar mobilization;Passive range of motion;Dry needling;Energy conservation;Joint Manipulations;Taping    PT Next Visit Plan  review HH HEP and progress ROM and strengthening    PT Home Exercise Plan  continue with current HH HEP    Consulted and Agree with Plan of Care  Patient  Patient will benefit from skilled therapeutic intervention in order to improve the following deficits and impairments:  Abnormal gait, Improper body mechanics, Pain, Decreased scar mobility, Decreased mobility, Decreased endurance, Decreased activity tolerance, Decreased range of motion, Decreased strength, Hypomobility, Impaired flexibility, Difficulty walking, Decreased balance  Visit Diagnosis: Muscle weakness (generalized)  Left knee pain, unspecified chronicity  Difficulty in walking, not elsewhere classified     Problem List Patient Active Problem List   Diagnosis Date Noted  . Total knee replacement status 09/17/2018  . Primary osteoarthritis of left knee 06/22/2018  . Status post total right knee replacement 08/17/2015  . Cervical radiculitis 02/28/2015  . DDD (degenerative disc disease), cervical 02/28/2015  . Anxiety state 05/14/2014  . Atrophic vaginitis 05/14/2014  . Benign essential hypertension 05/14/2014  . Other and unspecified hyperlipidemia 05/14/2014  . Symptomatic menopausal or female climacteric states 05/14/2014    Myrene GalasWesley Jasper Ruminski, PT DPT 11/13/2018, 12:05 PM  [place] [facility]IC[facility] [address] [place], [place], [address] Phone: [phone]   Fax:  [phone]  Name: Sheri Barker MRN: [id number] Date of Birth: 1929-03-19

## 2018-11-17 ENCOUNTER — Ambulatory Visit: Payer: Medicare Other

## 2018-11-19 ENCOUNTER — Ambulatory Visit: Payer: Medicare Other

## 2018-11-19 DIAGNOSIS — M6281 Muscle weakness (generalized): Secondary | ICD-10-CM

## 2018-11-19 DIAGNOSIS — M25562 Pain in left knee: Secondary | ICD-10-CM

## 2018-11-19 NOTE — Therapy (Signed)
[facility] [address] [place], [place], [address] Phone: [phone]   Fax:  [phone]  Physical Therapy Treatment/ Discharge Summary  Patient Details  Name: Sheri Barker MRN: [id number] Date of Birth: 01/14/1929 Referring Provider (PT): Hooten   Encounter Date: 11/19/2018  PT End of Session - 11/19/18 1312    Visit Number  10    Number of Visits  17    Date for PT Re-Evaluation  12/01/18    Authorization Type  10 /10 (PN start 10/06/2018)    PT Start Time  1300    PT Stop Time  1330    PT Time Calculation (min)  30 min    Activity Tolerance  Patient tolerated treatment well    Behavior During Therapy  [facility]FL for tasks assessed/performed       Past Medical History:  Diagnosis Date  . Arthritis   . Depression   . Elevated lipids   . Glaucoma    left eye  . Hypertension   . Hypothyroidism   . PONV (postoperative nausea and vomiting)     Past Surgical History:  Procedure Laterality Date  . ABDOMINAL HYSTERECTOMY    . CATARACT EXTRACTION W/ INTRAOCULAR LENS IMPLANT Bilateral   . JOINT REPLACEMENT    . KNEE ARTHROPLASTY Right 08/17/2015   Procedure: COMPUTER ASSISTED TOTAL KNEE ARTHROPLASTY;  Surgeon: Donato HeinzJames P Hooten, MD;  Location: [facility] ORS;  Service: Orthopedics;  Laterality: Right;  . KNEE ARTHROPLASTY Left 09/17/2018   Procedure: COMPUTER ASSISTED TOTAL KNEE ARTHROPLASTY;  Surgeon: Donato HeinzHooten, James P, MD;  Location: [facility] ORS;  Service: Orthopedics;  Laterality: Left;  . KNEE ARTHROSCOPY Right   . OSTEOTOMY TOES    . TONSILLECTOMY      There were no vitals filed for this visit.  Subjective Assessment - 11/19/18 1306    Subjective  Patient reports her low back pain was hurting the other day. Patient reports she is prepared for discharge.     Pertinent History  s/p L TKA 09/17/2018 and was discharged [facility]/2018. After d/c from SNF, patient had HH PT for 5 visits (discharged  10/03/2018). Stitches removed 10/01/2018. Patient had R TKA 08/2015. PMH: osteoporosis, DDD, OA, glaucoma, HTN, hyperlipidemia.    Limitations  Lifting;Standing;Walking;House hold activities    How long can you sit comfortably?  unlimited    How long can you stand comfortably?  15 min     How long can you walk comfortably?  > 10 min [facility] distances [facility]    Patient Stated Goals  drive, walk dog, exercise program, church and community activities    Currently in Pain?  No/denies    Pain Onset  More than a month ago       TREATMENT: Therapeutic Exercise Standing static forward lunges -- x 20  Standing squats with UE support -- x 20 to address glute/quad strength Sit to stands performed for speed to improve power and decrease fall risk -- x 20  Lateral lunges in standing with UE support -- x 20 to address hip and quad strength Hip abduction with GTB around knees in standing and UE support -- x 20 to address hip muscular strength Hip extension with GTB around knees in standing and UE support -- x 20 to address hip muscular strength  Patient demonstrates good coordination of exercises and performed exercises to improve LE strength.          PT Education - 11/19/18  1311    Education provided  Yes    Education Details  updated HEP; POC with goals    Person(s) Educated  Patient    Methods  Explanation;Demonstration    Comprehension  Verbalized understanding;Returned demonstration       PT Short Term Goals - 10/07/18 1237      PT SHORT TERM GOAL #1   Title  Pt will be independent with HEP in order to improve strength and balance in order to decrease fall risk and improve function at home and work.     Baseline  not initiated    Time  2    Period  Weeks    Status  New    Target Date  10/20/18        PT Long Term Goals - 11/19/18 1316      PT LONG TERM GOAL #1   Title  Patient will demosntrate improved function with left LE for walking and daily activties with FOTO  score improved to 56/100    Baseline  FOTO: 48/100; 11/19/2018: 60    Time  8    Period  Weeks    Status  Achieved      PT LONG TERM GOAL #2   Title  Patient will achieve 125 degrees of active knee flexion and 0 degrees of active knee extension on the LLE in order to improve overall function and mobility.    Baseline  L knee flexion: 107; L knee extension lacking 5 degrees; 11/19/2018: 131 flexion; 0 extension    Time  8    Period  Weeks    Status  Achieved      PT LONG TERM GOAL #3   Title  Patient will perform a 5TSTS with no UE use and no loss of balance from a standard height chair in under 15 seconds to demonstrate increased LE strength and decrease risk of falls.    Baseline  20.0 sec, no UE support;  11/19/2018: 8.6sec     Time  8    Period  Weeks    Status  Achieved            Plan - 11/19/18 1334    Clinical Impression Statement  Patient has achieved all long term goals and focused today's session on goal reassessment and performance of exercises to be added to her HEP. Patient demonstates good motor control with exercises requiring little to no cueing to perform. Patient demosntrates significant improvement in LE strength and is to be discharged from PT.     Rehab Potential  Good    Clinical Impairments Affecting Rehab Potential  (+)motivated, active PLOF(-)age, comorbidities: arthritis    PT Frequency  2x / week    PT Duration  8 weeks    PT Treatment/Interventions  Aquatic Therapy;Canalith Repostioning;Cryotherapy;Electrical Stimulation;Iontophoresis 4mg /ml Dexamethasone;Moist Heat;Ultrasound;Gait training;Stair training;Functional mobility training;Therapeutic activities;Therapeutic exercise;Balance training;Neuromuscular re-education;Patient/family education;Manual techniques;Scar mobilization;Passive range of motion;Dry needling;Energy conservation;Joint Manipulations;Taping    PT Next Visit Plan  review HH HEP and progress ROM and strengthening    PT Home Exercise Plan   continue with current HH HEP    Consulted and Agree with Plan of Care  Patient       Patient will benefit from skilled therapeutic intervention in order to improve the following deficits and impairments:  Abnormal gait, Improper body mechanics, Pain, Decreased scar mobility, Decreased mobility, Decreased endurance, Decreased activity tolerance, Decreased range of motion, Decreased strength, Hypomobility, Impaired flexibility, Difficulty walking, Decreased balance  Visit Diagnosis: Muscle  weakness (generalized)  Left knee pain, unspecified chronicity     Problem List Patient Active Problem List   Diagnosis Date Noted  . Total knee replacement status 09/17/2018  . Primary osteoarthritis of left knee 06/22/2018  . Status post total right knee replacement 08/17/2015  . Cervical radiculitis 02/28/2015  . DDD (degenerative disc disease), cervical 02/28/2015  . Anxiety state 05/14/2014  . Atrophic vaginitis 05/14/2014  . Benign essential hypertension 05/14/2014  . Other and unspecified hyperlipidemia 05/14/2014  . Symptomatic menopausal or female climacteric states 05/14/2014    Myrene GalasWesley Anora Schwenke, PT DPT 11/19/2018, 1:36 PM  [facility] [address] [place], [place], [address] Phone: [phone]   Fax:  [phone]  Name: Sheri Barker MRN: [id number] Date of Birth: 1929/03/21

## 2018-12-22 ENCOUNTER — Encounter: Payer: Self-pay | Admitting: Podiatry

## 2018-12-22 ENCOUNTER — Ambulatory Visit (INDEPENDENT_AMBULATORY_CARE_PROVIDER_SITE_OTHER): Payer: Medicare Other | Admitting: Podiatry

## 2018-12-22 DIAGNOSIS — Q828 Other specified congenital malformations of skin: Secondary | ICD-10-CM

## 2018-12-22 DIAGNOSIS — M2042 Other hammer toe(s) (acquired), left foot: Secondary | ICD-10-CM

## 2018-12-22 DIAGNOSIS — M216X2 Other acquired deformities of left foot: Secondary | ICD-10-CM

## 2018-12-22 NOTE — Progress Notes (Signed)
This patient presents the office with chief complaint of a painful skin lesion on the bottom of her left forefoot.  She says this callus becomes painful walking and wearing her shoes.  She was treated by myself 6 months ago in this office.  Patient states that she is very active and has had multiple foot surgeries.  Her second toe left foot is significantly elevated due to previous surgery.  She presents the office today for an evaluation and treatment of her painful skin lesion.  Vascular  Dorsalis pedis and posterior tibial pulses are palpable  B/L.  Capillary return  WNL.  Temperature gradient is  WNL.  Skin turgor  WNL  Sensorium  Senn Weinstein monofilament wire  WNL. Normal tactile sensation.  Nail Exam  Patient has normal nails with no evidence of bacterial or fungal infection.  Orthopedic  Exam  Muscle tone and muscle strength  WNL.  No limitations of motion feet  B/L.  No crepitus or joint effusion noted.  Multiple contracted digits.  Dislocated second digit left foot wjth plantar flexed second metatarsal left foot. Bony prominences are unremarkable.  Skin  No open lesions.  Normal skin texture and turgor. Porokeratosis sub 2 left foot.  Porokeratosis secondary to plantarflexed metatarsal secondary to contracted second digit left foot  ROV.  Debride porokeratosis left foot.  Padding applied to left shoe.  RTC 10 weeks.  Helane Gunther DPM

## 2019-06-24 IMAGING — DX DG KNEE 1-2V PORT*L*
2 series · 2 of 2 positions shown · non-contrast
Comparison: None.

CLINICAL DATA: Followup knee arthroplasty

EXAM:
PORTABLE LEFT KNEE - 1-2 VIEW

[knee ap]
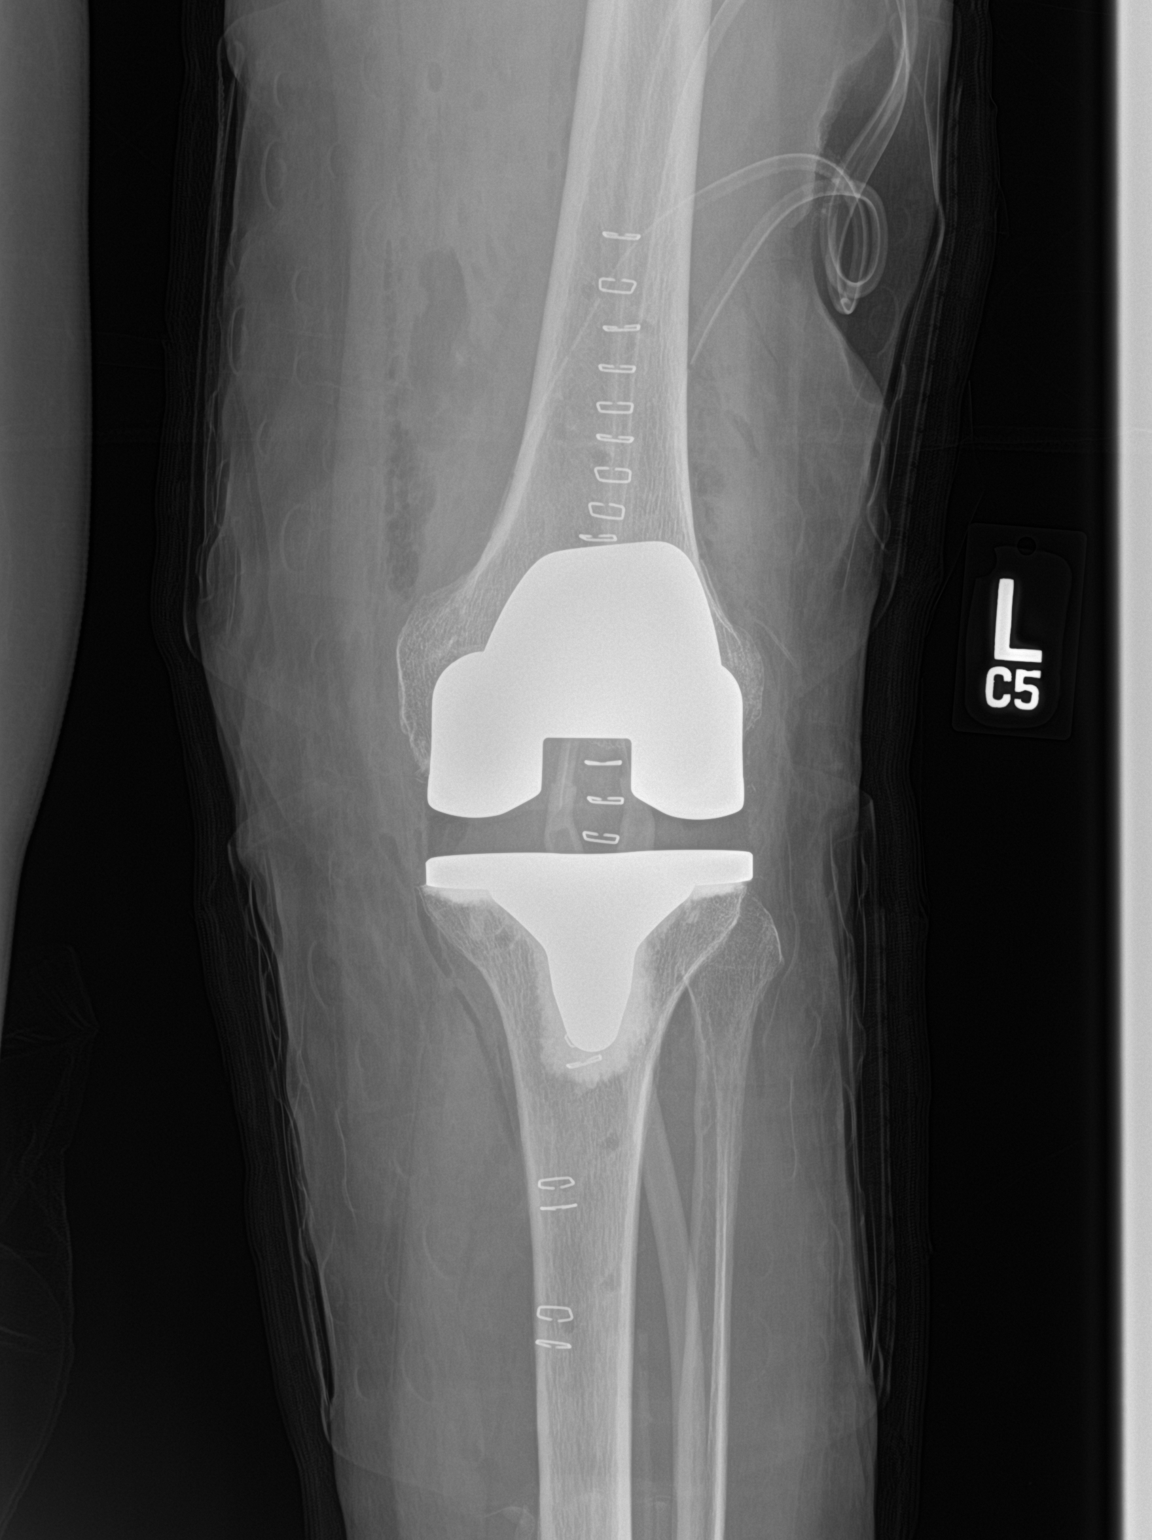

[knee lat]
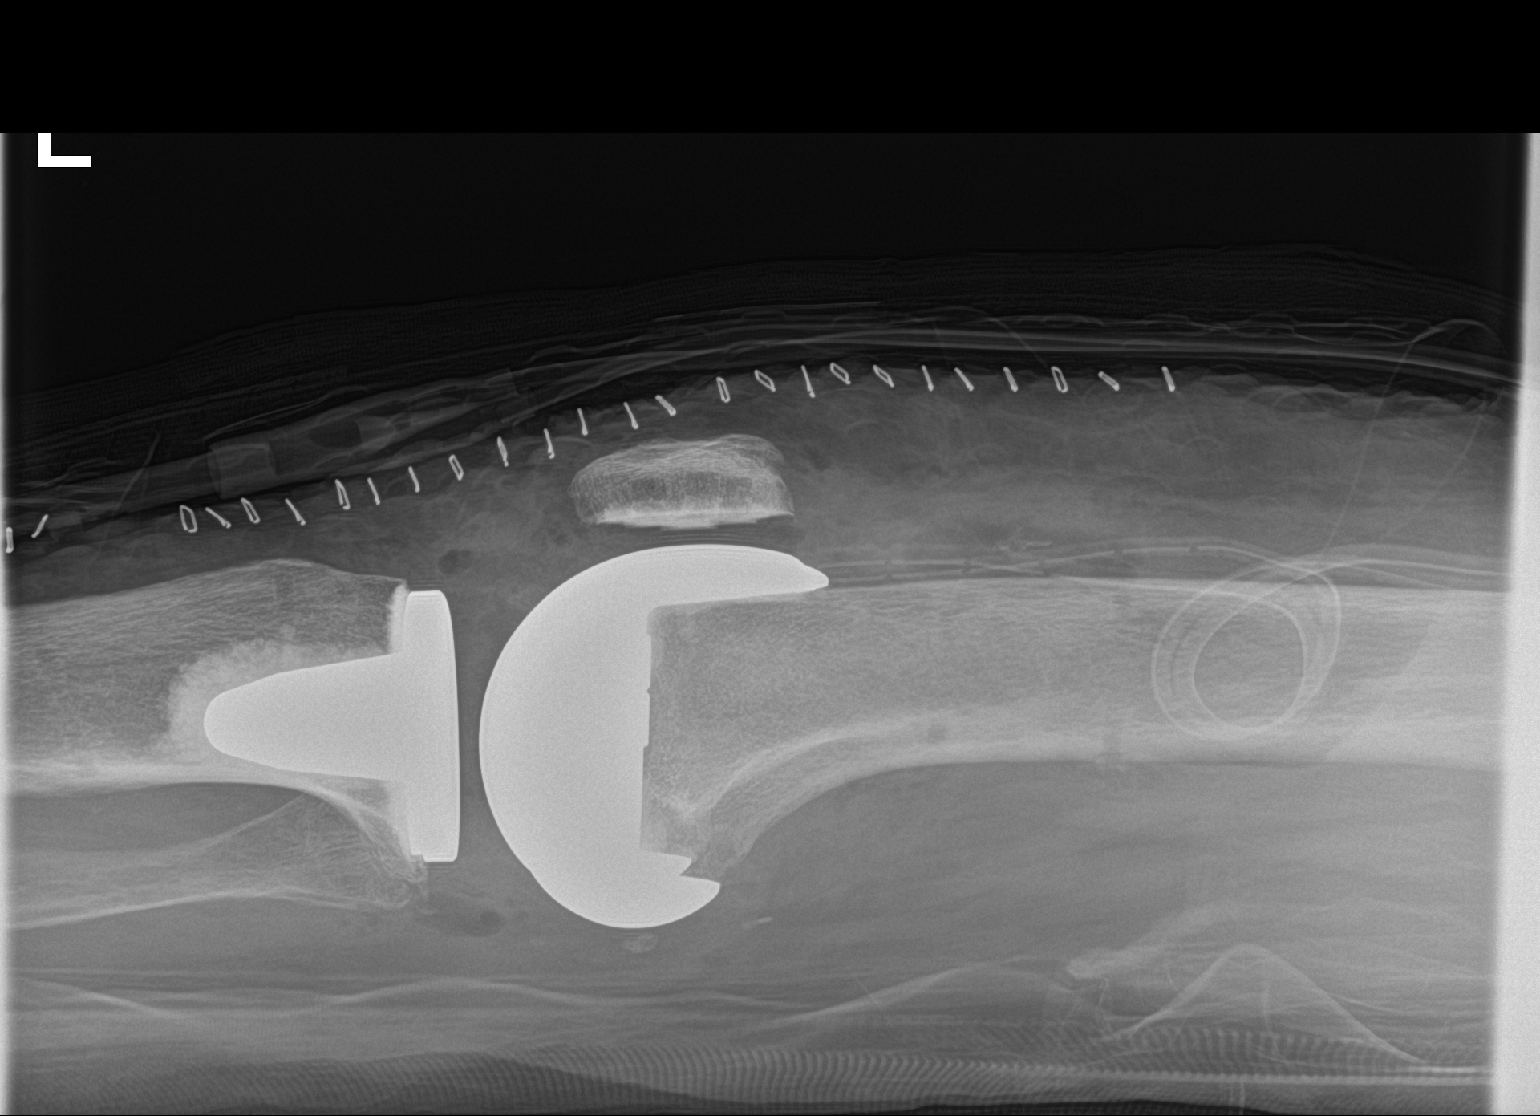

[2 of 2 positions shown; findings below may reference images not displayed]

FINDINGS: Two-view show total knee arthroplasty. Components appear well
positioned. Drain in the suprapatellar region. No radiographically
detectable complication.
IMPRESSION: Good appearance following total knee arthroplasty.

## 2019-08-14 IMAGING — MG DIGITAL SCREENING BILATERAL MAMMOGRAM WITH TOMO AND CAD
8 series · 8 of 24 positions shown · non-contrast
Comparison: Previous exam(s).

CLINICAL DATA: Screening.

EXAM:
DIGITAL SCREENING BILATERAL MAMMOGRAM WITH TOMO AND CAD

[R MLO synth-2D]
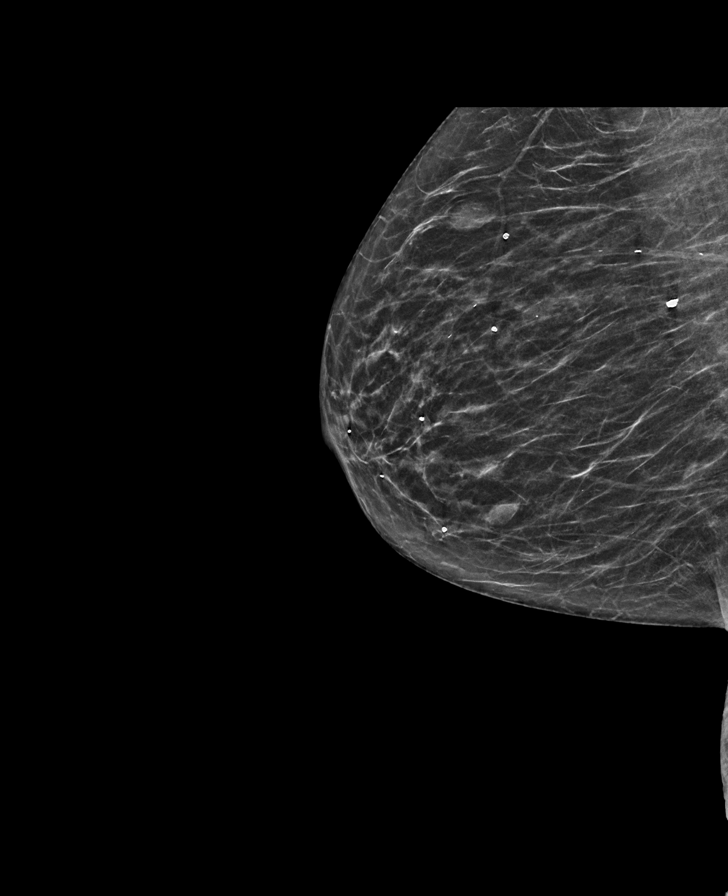

[L CC synth-2D]
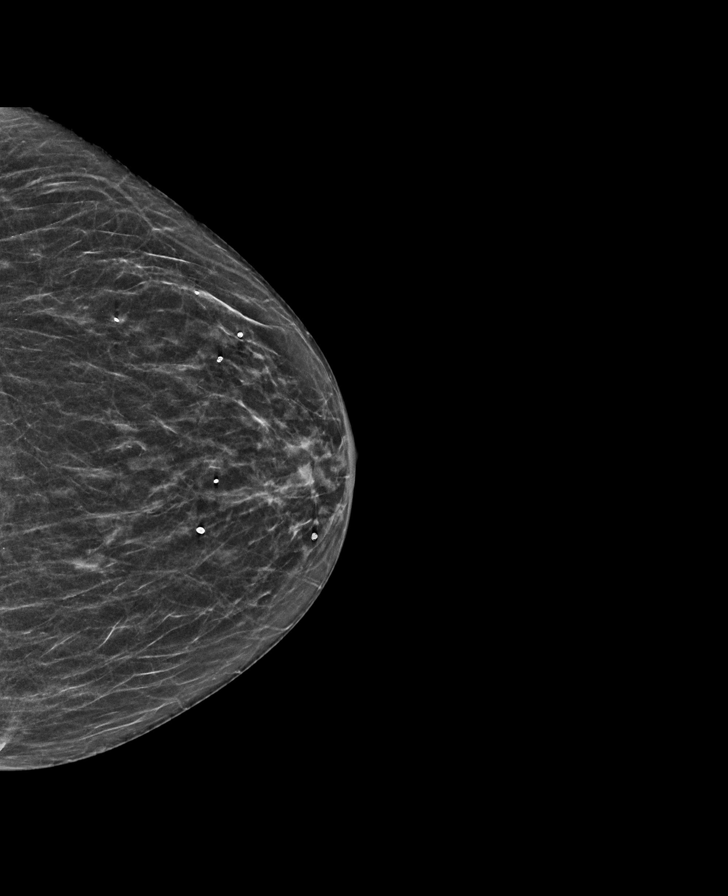

[L MLO synth-2D]
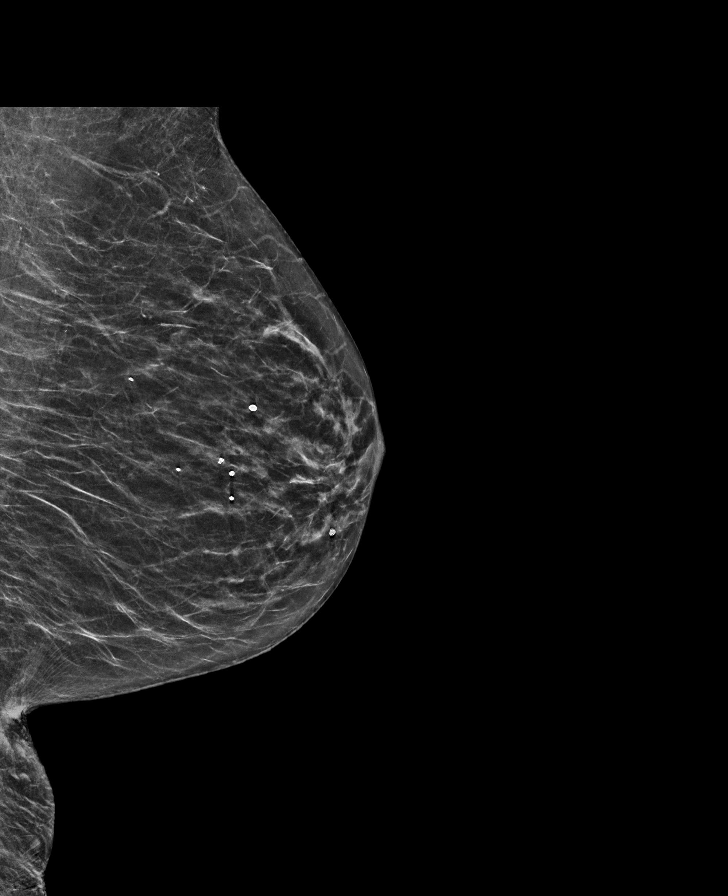

[R CC synth-2D]
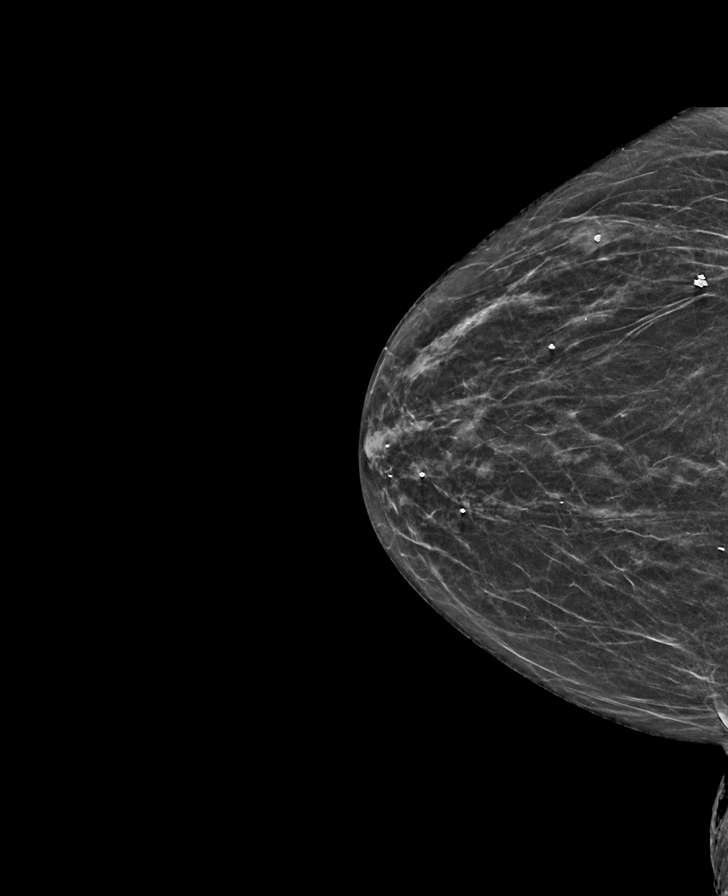

[R CC tomo · tomo slice 23/46.0]
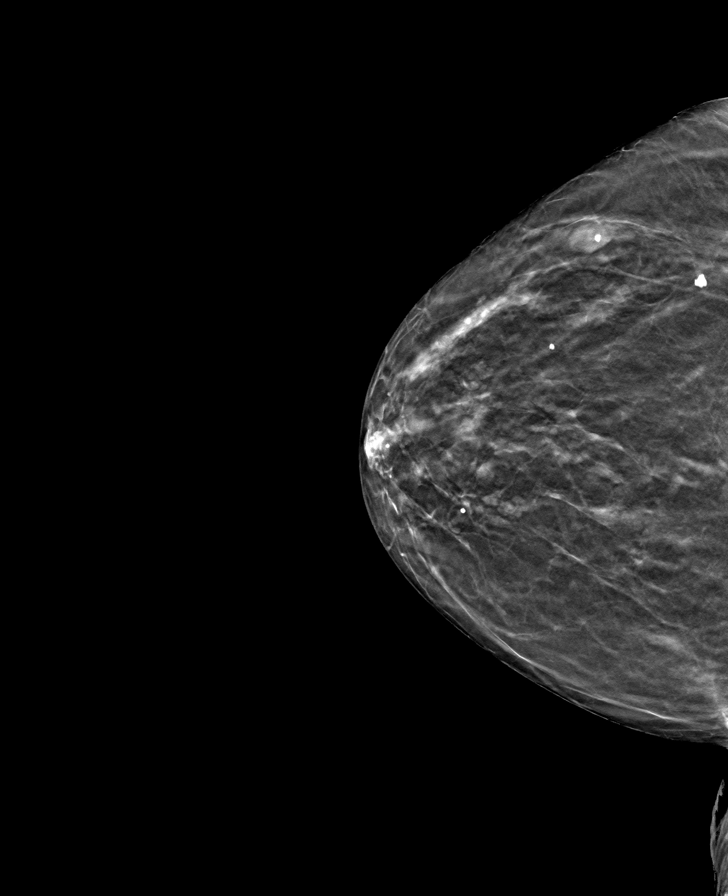

[L CC tomo · tomo slice 23/46.0]
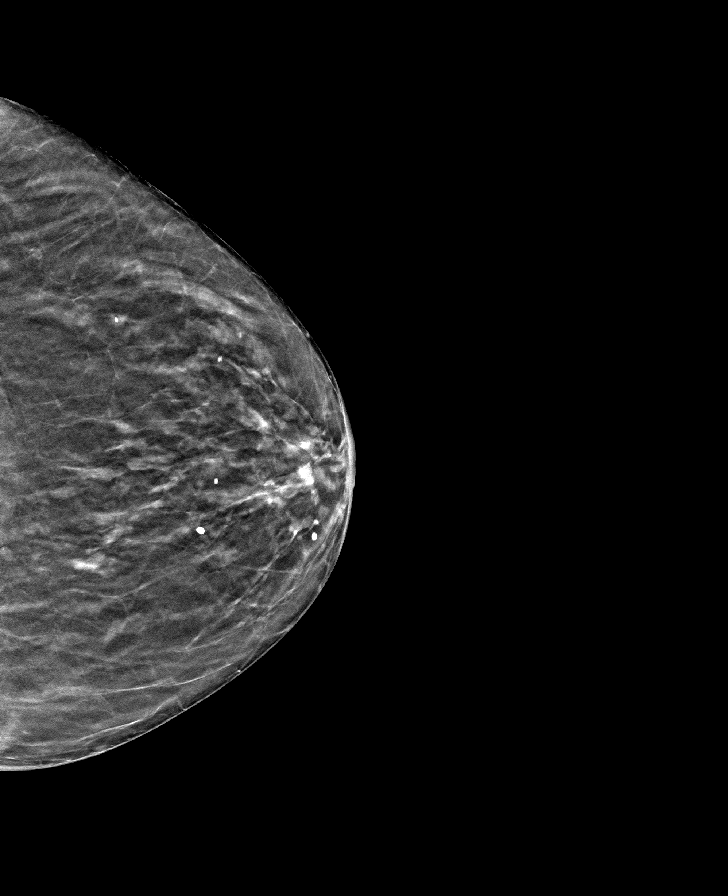

[R MLO tomo · tomo slice 25/48.0]
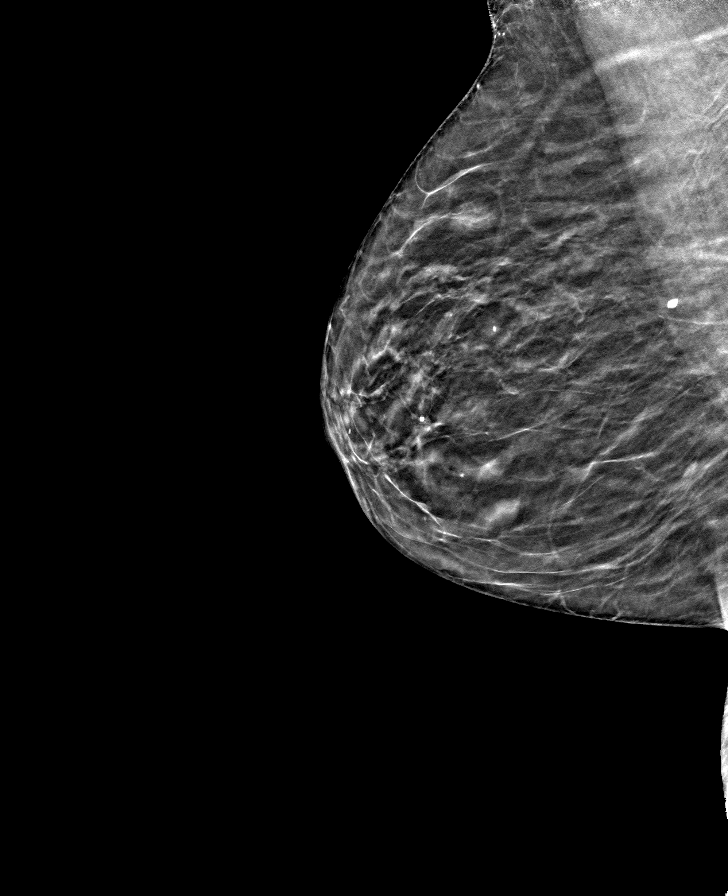

[L MLO tomo · tomo slice 25/50.0]
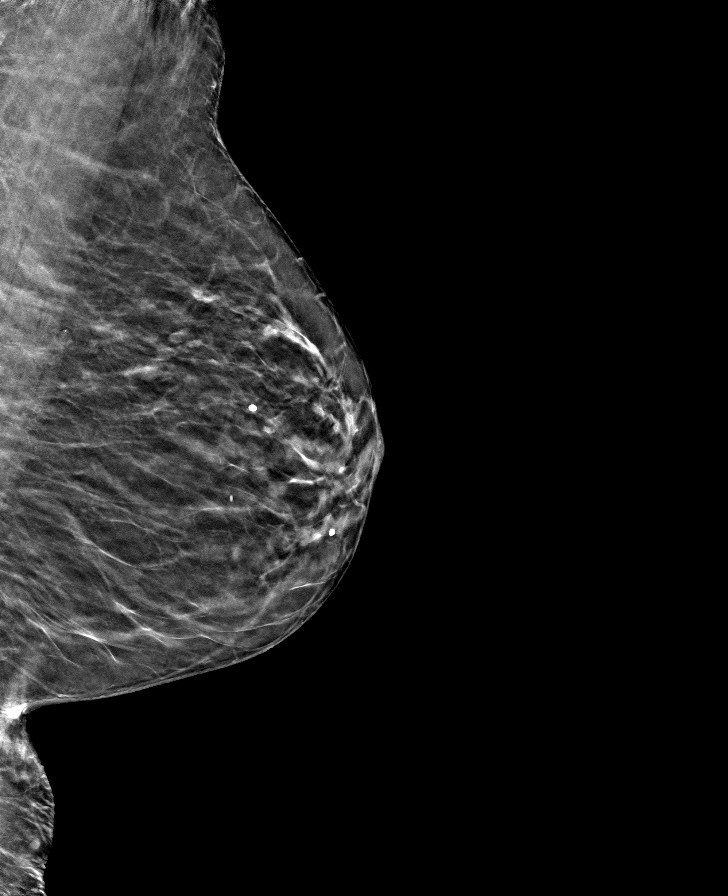

[8 of 24 positions shown; findings below may reference images not displayed]

ACR Breast Density Category b: There are scattered areas of
fibroglandular density.
FINDINGS: There are no findings suspicious for malignancy. Images were
processed with CAD.
IMPRESSION: No mammographic evidence of malignancy. A result letter of this
screening mammogram will be mailed directly to the patient.

RECOMMENDATION:
Screening mammogram in one year. (Code:[id number])

BI-RADS CATEGORY  1: Negative.

## 2019-11-27 ENCOUNTER — Other Ambulatory Visit: Payer: Self-pay | Admitting: Family Medicine

## 2019-11-27 DIAGNOSIS — Z1231 Encounter for screening mammogram for malignant neoplasm of breast: Secondary | ICD-10-CM

## 2020-01-18 ENCOUNTER — Ambulatory Visit
Admission: RE | Admit: 2020-01-18 | Discharge: 2020-01-18 | Disposition: A | Discharge status: Home / Self Care | Payer: Medicare Other | Source: Ambulatory Visit | Attending: Family Medicine | Admitting: Family Medicine

## 2020-01-18 DIAGNOSIS — Z1231 Encounter for screening mammogram for malignant neoplasm of breast: Secondary | ICD-10-CM | POA: Diagnosis not present

## 2021-01-04 ENCOUNTER — Other Ambulatory Visit: Payer: Self-pay | Admitting: Family Medicine

## 2021-01-04 DIAGNOSIS — Z1231 Encounter for screening mammogram for malignant neoplasm of breast: Secondary | ICD-10-CM

## 2021-01-23 ENCOUNTER — Ambulatory Visit
Admission: RE | Admit: 2021-01-23 | Discharge: 2021-01-23 | Disposition: A | Discharge status: Home / Self Care | Payer: Medicare Other | Source: Ambulatory Visit | Attending: Family Medicine | Admitting: Family Medicine

## 2021-01-23 ENCOUNTER — Other Ambulatory Visit: Payer: Self-pay

## 2021-01-23 DIAGNOSIS — Z1231 Encounter for screening mammogram for malignant neoplasm of breast: Secondary | ICD-10-CM | POA: Insufficient documentation

## 2021-03-23 ENCOUNTER — Encounter: Payer: Self-pay | Admitting: Podiatry

## 2021-03-23 ENCOUNTER — Ambulatory Visit (INDEPENDENT_AMBULATORY_CARE_PROVIDER_SITE_OTHER): Payer: Medicare Other | Admitting: Podiatry

## 2021-03-23 ENCOUNTER — Other Ambulatory Visit: Payer: Self-pay

## 2021-03-23 DIAGNOSIS — M216X2 Other acquired deformities of left foot: Secondary | ICD-10-CM

## 2021-03-23 DIAGNOSIS — Q828 Other specified congenital malformations of skin: Secondary | ICD-10-CM

## 2021-03-23 NOTE — Progress Notes (Signed)
This patient presents to the office with painful callus under the ball of her left foot.  This callus is painful walking and wearing her shoes.  She has had previous surgery and now has dislocated second toe left foot.  She presents for treatment of this painful callus.  Vascular  Dorsalis pedis and posterior tibial pulses are palpable  B/L.  Capillary return  WNL.  Temperature gradient is  WNL.  Skin turgor  WNL  Sensorium  Senn Weinstein monofilament wire  WNL. Normal tactile sensation.  Nail Exam  Patient has normal nails with no evidence of bacterial or fungal infection.  Orthopedic  Exam  Muscle tone and muscle strength  WNL.  No limitations of motion feet  B/L.  No crepitus or joint effusion noted.  Foot type is unremarkable and digits show no abnormalities.  Bony prominences are unremarkable.  Dislocated 2nd  MPJ left foot.  Plantar flexed second metatarsal head  2 left.  Skin  No open lesions.  Normal skin texture and turgor. Callus/porokeratosis sub 2 left foot  Callus/porokeratosis left foot.  Debride callus  RTC prn.  Marvette Schamp DPM 

## 2022-01-18 ENCOUNTER — Other Ambulatory Visit: Payer: Self-pay | Admitting: Family Medicine

## 2022-02-27 ENCOUNTER — Ambulatory Visit
Admission: RE | Admit: 2022-02-27 | Discharge: 2022-02-27 | Disposition: A | Discharge status: Home / Self Care | Payer: Medicare Other | Source: Ambulatory Visit | Attending: Family Medicine | Admitting: Family Medicine

## 2022-02-27 DIAGNOSIS — Z1231 Encounter for screening mammogram for malignant neoplasm of breast: Secondary | ICD-10-CM | POA: Insufficient documentation

## 2022-09-11 ENCOUNTER — Ambulatory Visit: Payer: Medicare Other | Admitting: Podiatry

## 2022-09-20 ENCOUNTER — Ambulatory Visit (INDEPENDENT_AMBULATORY_CARE_PROVIDER_SITE_OTHER): Payer: Medicare Other | Admitting: Podiatry

## 2022-09-20 DIAGNOSIS — Q828 Other specified congenital malformations of skin: Secondary | ICD-10-CM | POA: Diagnosis not present

## 2022-09-26 NOTE — Progress Notes (Signed)
This patient presents to the office with painful callus under the ball of her left foot.  This callus is painful walking and wearing her shoes.  She has had previous surgery and now has dislocated second toe left foot.  She presents for treatment of this painful callus.  Vascular  Dorsalis pedis and posterior tibial pulses are palpable  B/L.  Capillary return  WNL.  Temperature gradient is  WNL.  Skin turgor  WNL  Sensorium  Senn Weinstein monofilament wire  WNL. Normal tactile sensation.  Nail Exam  Patient has normal nails with no evidence of bacterial or fungal infection.  Orthopedic  Exam  Muscle tone and muscle strength  WNL.  No limitations of motion feet  B/L.  No crepitus or joint effusion noted.  Foot type is unremarkable and digits show no abnormalities.  Bony prominences are unremarkable.  Dislocated 2nd  MPJ left foot.  Plantar flexed second metatarsal head  2 left.  Skin  No open lesions.  Normal skin texture and turgor. Callus/porokeratosis sub 2 left foot  Callus/porokeratosis left foot.  Debride the porokeratotic tissue with chisel blade to handle the central nucleated core was excised out.  No complication noted no pinpoint bleeding noted  Nicholes Rough D.P.M.

## 2022-12-27 ENCOUNTER — Ambulatory Visit (INDEPENDENT_AMBULATORY_CARE_PROVIDER_SITE_OTHER): Payer: Medicare Other | Admitting: Podiatry

## 2022-12-27 ENCOUNTER — Ambulatory Visit: Payer: Medicare Other | Admitting: Podiatry

## 2022-12-27 ENCOUNTER — Encounter: Payer: Self-pay | Admitting: Podiatry

## 2022-12-27 VITALS — BP 186/75 | HR 60

## 2022-12-27 DIAGNOSIS — M216X2 Other acquired deformities of left foot: Secondary | ICD-10-CM | POA: Diagnosis not present

## 2022-12-27 DIAGNOSIS — Q828 Other specified congenital malformations of skin: Secondary | ICD-10-CM | POA: Diagnosis not present

## 2022-12-27 NOTE — Progress Notes (Signed)
This patient presents to the office with painful callus under the ball of her left foot.  This callus is painful walking and wearing her shoes.  She has had previous surgery and now has dislocated second toe left foot.  She presents for treatment of this painful callus.  Vascular  Dorsalis pedis and posterior tibial pulses are palpable  B/L.  Capillary return  WNL.  Temperature gradient is  WNL.  Skin turgor  WNL  Sensorium  Senn Weinstein monofilament wire  WNL. Normal tactile sensation.  Nail Exam  Patient has normal nails with no evidence of bacterial or fungal infection.  Orthopedic  Exam  Muscle tone and muscle strength  WNL.  No limitations of motion feet  B/L.  No crepitus or joint effusion noted.  Foot type is unremarkable and digits show no abnormalities.  Bony prominences are unremarkable.  Dislocated 2nd  MPJ left foot.  Plantar flexed second metatarsal head  2 left.  Skin  No open lesions.  Normal skin texture and turgor. Callus/porokeratosis sub 2 left foot  Callus/porokeratosis left foot.  Debride callus  RTC prn.  Gardiner Barefoot DPM

## 2023-02-18 ENCOUNTER — Other Ambulatory Visit: Payer: Self-pay | Admitting: Family Medicine

## 2023-02-18 ENCOUNTER — Encounter: Payer: Self-pay | Admitting: Family Medicine

## 2023-02-18 DIAGNOSIS — Z1231 Encounter for screening mammogram for malignant neoplasm of breast: Secondary | ICD-10-CM

## 2023-03-14 ENCOUNTER — Ambulatory Visit
Admission: RE | Admit: 2023-03-14 | Discharge: 2023-03-14 | Disposition: A | Discharge status: Home / Self Care | Payer: Medicare Other | Source: Ambulatory Visit | Attending: Family Medicine | Admitting: Family Medicine

## 2023-03-14 DIAGNOSIS — Z1231 Encounter for screening mammogram for malignant neoplasm of breast: Secondary | ICD-10-CM | POA: Diagnosis present

## 2024-03-10 ENCOUNTER — Other Ambulatory Visit: Payer: Self-pay | Admitting: Family Medicine

## 2024-03-10 DIAGNOSIS — Z1231 Encounter for screening mammogram for malignant neoplasm of breast: Secondary | ICD-10-CM

## 2024-04-02 ENCOUNTER — Ambulatory Visit
Admission: RE | Admit: 2024-04-02 | Discharge: 2024-04-02 | Disposition: A | Discharge status: Home / Self Care | Source: Ambulatory Visit | Attending: Family Medicine | Admitting: Family Medicine

## 2024-04-02 DIAGNOSIS — Z1231 Encounter for screening mammogram for malignant neoplasm of breast: Secondary | ICD-10-CM | POA: Diagnosis present
